# Patient Record
Sex: Female | Born: 1950 | Race: White | Hispanic: No | Marital: Married | Smoking: Never smoker
Health system: Southern US, Community
[De-identification: ages and names within clinical notes are randomized; demographics above are authoritative.]

## PROBLEM LIST (undated history)

## (undated) DIAGNOSIS — K219 Gastro-esophageal reflux disease without esophagitis: Secondary | ICD-10-CM

## (undated) DIAGNOSIS — T4145XA Adverse effect of unspecified anesthetic, initial encounter: Secondary | ICD-10-CM

## (undated) DIAGNOSIS — E079 Disorder of thyroid, unspecified: Secondary | ICD-10-CM

## (undated) DIAGNOSIS — G4730 Sleep apnea, unspecified: Secondary | ICD-10-CM

## (undated) DIAGNOSIS — T7840XA Allergy, unspecified, initial encounter: Secondary | ICD-10-CM

## (undated) DIAGNOSIS — K449 Diaphragmatic hernia without obstruction or gangrene: Secondary | ICD-10-CM

## (undated) DIAGNOSIS — H269 Unspecified cataract: Secondary | ICD-10-CM

## (undated) DIAGNOSIS — E611 Iron deficiency: Secondary | ICD-10-CM

## (undated) DIAGNOSIS — Z9889 Other specified postprocedural states: Secondary | ICD-10-CM

## (undated) DIAGNOSIS — H18519 Endothelial corneal dystrophy, unspecified eye: Secondary | ICD-10-CM

## (undated) DIAGNOSIS — M1990 Unspecified osteoarthritis, unspecified site: Secondary | ICD-10-CM

## (undated) DIAGNOSIS — J302 Other seasonal allergic rhinitis: Secondary | ICD-10-CM

## (undated) DIAGNOSIS — H9190 Unspecified hearing loss, unspecified ear: Secondary | ICD-10-CM

## (undated) DIAGNOSIS — R42 Dizziness and giddiness: Secondary | ICD-10-CM

## (undated) DIAGNOSIS — M810 Age-related osteoporosis without current pathological fracture: Secondary | ICD-10-CM

## (undated) DIAGNOSIS — T8859XA Other complications of anesthesia, initial encounter: Secondary | ICD-10-CM

## (undated) DIAGNOSIS — I10 Essential (primary) hypertension: Secondary | ICD-10-CM

## (undated) DIAGNOSIS — H1851 Endothelial corneal dystrophy: Secondary | ICD-10-CM

## (undated) DIAGNOSIS — K3184 Gastroparesis: Secondary | ICD-10-CM

## (undated) DIAGNOSIS — R112 Nausea with vomiting, unspecified: Secondary | ICD-10-CM

## (undated) HISTORY — DX: Endothelial corneal dystrophy, unspecified eye: H18.519

## (undated) HISTORY — PX: BLADDER REPAIR: SHX76

## (undated) HISTORY — PX: COLONOSCOPY: SHX174

## (undated) HISTORY — DX: Endothelial corneal dystrophy: H18.51

## (undated) HISTORY — PX: BUNIONECTOMY: SHX129

## (undated) HISTORY — DX: Unspecified osteoarthritis, unspecified site: M19.90

## (undated) HISTORY — PX: PELVIC LAPAROSCOPY: SHX162

## (undated) HISTORY — PX: ABDOMINAL HYSTERECTOMY: SHX81

## (undated) HISTORY — DX: Dizziness and giddiness: R42

## (undated) HISTORY — DX: Allergy, unspecified, initial encounter: T78.40XA

## (undated) HISTORY — DX: Disorder of thyroid, unspecified: E07.9

## (undated) HISTORY — DX: Gastro-esophageal reflux disease without esophagitis: K21.9

## (undated) HISTORY — DX: Gastroparesis: K31.84

## (undated) HISTORY — DX: Iron deficiency: E61.1

## (undated) HISTORY — PX: RHINOPLASTY: SUR1284

## (undated) HISTORY — DX: Sleep apnea, unspecified: G47.30

## (undated) HISTORY — PX: UPPER GASTROINTESTINAL ENDOSCOPY: SHX188

## (undated) HISTORY — DX: Essential (primary) hypertension: I10

## (undated) HISTORY — DX: Unspecified cataract: H26.9

## (undated) HISTORY — DX: Age-related osteoporosis without current pathological fracture: M81.0

## (undated) HISTORY — DX: Diaphragmatic hernia without obstruction or gangrene: K44.9

## (undated) HISTORY — DX: Unspecified hearing loss, unspecified ear: H91.90

---

## 1978-04-29 HISTORY — PX: RHINOPLASTY: SUR1284

## 1998-05-17 ENCOUNTER — Encounter: Payer: Self-pay | Admitting: Gastroenterology

## 1998-05-17 ENCOUNTER — Ambulatory Visit (HOSPITAL_COMMUNITY): Admission: RE | Admit: 1998-05-17 | Discharge: 1998-05-17 | Payer: Self-pay | Admitting: Gastroenterology

## 1998-12-22 ENCOUNTER — Other Ambulatory Visit: Admission: RE | Admit: 1998-12-22 | Discharge: 1998-12-22 | Payer: Self-pay | Admitting: Obstetrics and Gynecology

## 2000-04-29 HISTORY — PX: NISSEN FUNDOPLICATION: SHX2091

## 2000-08-04 ENCOUNTER — Ambulatory Visit (HOSPITAL_COMMUNITY): Admission: RE | Admit: 2000-08-04 | Discharge: 2000-08-04 | Payer: Self-pay | Admitting: Gastroenterology

## 2000-08-19 ENCOUNTER — Ambulatory Visit (HOSPITAL_COMMUNITY): Admission: RE | Admit: 2000-08-19 | Discharge: 2000-08-19 | Payer: Self-pay | Admitting: General Surgery

## 2000-08-19 ENCOUNTER — Encounter: Payer: Self-pay | Admitting: General Surgery

## 2000-08-19 DIAGNOSIS — K3184 Gastroparesis: Secondary | ICD-10-CM

## 2000-08-19 HISTORY — DX: Gastroparesis: K31.84

## 2000-09-03 ENCOUNTER — Encounter (INDEPENDENT_AMBULATORY_CARE_PROVIDER_SITE_OTHER): Payer: Self-pay

## 2000-09-03 ENCOUNTER — Other Ambulatory Visit: Admission: RE | Admit: 2000-09-03 | Discharge: 2000-09-03 | Payer: Self-pay | Admitting: Obstetrics and Gynecology

## 2003-04-30 HISTORY — PX: ABDOMINAL HYSTERECTOMY: SHX81

## 2004-03-19 ENCOUNTER — Inpatient Hospital Stay: Payer: Self-pay | Admitting: Obstetrics and Gynecology

## 2004-06-27 ENCOUNTER — Ambulatory Visit: Payer: Self-pay | Admitting: Obstetrics and Gynecology

## 2009-03-02 ENCOUNTER — Encounter: Admission: RE | Admit: 2009-03-02 | Discharge: 2009-03-02 | Payer: Self-pay | Admitting: Gastroenterology

## 2009-04-06 ENCOUNTER — Ambulatory Visit (HOSPITAL_COMMUNITY): Admission: RE | Admit: 2009-04-06 | Discharge: 2009-04-06 | Payer: Self-pay | Admitting: Gastroenterology

## 2009-04-26 ENCOUNTER — Encounter: Admission: RE | Admit: 2009-04-26 | Discharge: 2009-04-26 | Payer: Self-pay | Admitting: Gastroenterology

## 2009-07-21 ENCOUNTER — Emergency Department (HOSPITAL_COMMUNITY): Admission: EM | Admit: 2009-07-21 | Discharge: 2009-07-21 | Payer: Self-pay | Admitting: Emergency Medicine

## 2010-05-20 ENCOUNTER — Encounter: Payer: Self-pay | Admitting: Gastroenterology

## 2010-09-14 NOTE — Op Note (Signed)
[facility]  Patient:    Jennifer Alvarez, STINSON                       MRN: [id number] Proc. Date: 08/04/00 Attending:  Verlin Grills, M.D. CC:         Adolph Pollack, M.D.   Operative Report  PROCEDURE:  Esophageal manometry.  ENDOSCOPIST:  Verlin Grills, M.D.  REFERRING PHYSICIAN:  Adolph Pollack, M.D.  INDICATION:  [name]. [name] (date of birth 03-31-1951) is a 61 year old female with gastroesophageal reflux disease manifested by heartburn and regurgitation.  On a double-dose proton pump inhibitor medication, she has reasonable control of her heartburn but poor control of her regurgitation. Ms. [name] is being evaluated for laparoscopic antireflux surgery.  Ms. [name] underwent an esophageal manometry at the [facility] on August 04, 2000.  Lower esophageal sphincter function:  The resting lower esophageal sphincter pressure is 11.3 mmHg which is on the low end of normal.  The sphincter relaxes 75% for a duration of 6.1 seconds with swallow.  Esophageal body function based on 10 wet swallows: 46% of her wet swallows induced peristaltic esophageal body contraction; 54% of her wet swallows resulted in nonperistaltic esophageal body contraction.  The average wave amplitude of the peristaltic contractions was 113 mmHg which is normal.  IMPRESSION: 1. Lowish resting lower esophageal sphincter pressure. 2. Esophageal body function consistent with diffuse esophageal spasm. DD:  08/15/00 TD:  08/18/00 Job: [id number]

## 2011-07-07 ENCOUNTER — Ambulatory Visit (INDEPENDENT_AMBULATORY_CARE_PROVIDER_SITE_OTHER): Payer: BC Managed Care – PPO | Admitting: Family Medicine

## 2011-07-07 VITALS — BP 133/84 | HR 71 | Temp 98.6°F | Resp 16 | Ht 59.0 in | Wt 132.6 lb

## 2011-07-07 DIAGNOSIS — J069 Acute upper respiratory infection, unspecified: Secondary | ICD-10-CM

## 2011-07-07 DIAGNOSIS — K219 Gastro-esophageal reflux disease without esophagitis: Secondary | ICD-10-CM

## 2011-07-07 MED ORDER — METHYLPREDNISOLONE 4 MG PO TABS: ORAL_TABLET | ORAL | Status: DC

## 2011-07-07 NOTE — Progress Notes (Signed)
61 yo woman being treated for sinus infection x 10 days with cefdinir and cortisone shot.  Improved for 4 days.  By Wednesday, though, fatigue began to return and rhinorrhea returned.  On Friday the cough had worsened and the sinuses were filling up.  Has continued to have progressive cough with phlegm.  O:  NAD, sinus sounding voice, alert Skin warm and dry HEENT:  Unremarkable Chest: CTA  A:  URI - sinus  P: Medrol 4 mg 3 -2-2-2-1-1-1

## 2011-08-23 ENCOUNTER — Telehealth: Payer: Self-pay | Admitting: Internal Medicine

## 2011-08-23 NOTE — Telephone Encounter (Signed)
Received copies from [facility] ,on 08/23/11 . Forwarded 19  pages to Dr.  Nobie Putnam review.

## 2011-09-02 ENCOUNTER — Encounter: Payer: Self-pay | Admitting: *Deleted

## 2011-09-10 ENCOUNTER — Encounter: Payer: Self-pay | Admitting: Internal Medicine

## 2011-09-10 ENCOUNTER — Ambulatory Visit (INDEPENDENT_AMBULATORY_CARE_PROVIDER_SITE_OTHER): Payer: Self-pay | Admitting: Internal Medicine

## 2011-09-10 VITALS — BP 134/76 | HR 62 | Ht 59.0 in | Wt 134.0 lb

## 2011-09-10 DIAGNOSIS — K219 Gastro-esophageal reflux disease without esophagitis: Secondary | ICD-10-CM

## 2011-09-10 MED ORDER — DEXLANSOPRAZOLE 60 MG PO CPDR: 60.0000 mg | DELAYED_RELEASE_CAPSULE | Freq: Two times a day (BID) | ORAL | Status: DC

## 2011-09-10 NOTE — Patient Instructions (Signed)
You have been scheduled for an endoscopy with propofol. Please follow written instructions given to you at your visit today. You have been scheduled for a Barium Esophogram at [facility] (1st floor of the hospital) on Wednesday 09/18/11 at 9:30 am. Please arrive 15 minutes prior to your appointment for registration. Make certain not to have anything to eat or drink 6 hours prior to your test. If you need to reschedule for any reason, please contact radiology at [phone] to do so. We have sent the following medications to your pharmacy for you to pick up at your convenience: Dexilant 60 mg twice daily. CC:Dr Marjory Lies

## 2011-09-10 NOTE — Progress Notes (Addendum)
Jennifer Alvarez 07/25/1950 MRN [id number]  History of Present Illness:  This is a 61 year old white female with chronic gastroesophageal reflux who comes for a second opinion of intractable reflux. She had a Nissen fundoplication in 2002 by Dr. Katrinka Blazing at [facility]. The surgery was successful in reducing her reflux. She had no symptoms until April 2006 when she had an episode of vomiting and since then has had problems with continued reflux, regurgitation and esophageal spasm. A barium swallow in November 2010 showed disruption of three out of four primary esophageal peristaltic waves, a large hernia with free reflux into the esophagus. A 13 mm tablet passed without delay. An upper endoscopy in December 2007 showed a loose wrap. Prior to her initial surgery, a gastric emptying scan showed delayed emptying. 39% of the radio nucleotide remained in the stomach in 2 hours. Her last colonoscopy in December 2007 showed scattered diverticuli. She gives a history of peptic ulcer at age 35. She says that she is tired of being told to double up on her PPIs. She cannot eat after 7 PM and it interferes with her being able to eat out. She needs at least 4-5 hours between meals and laying down to digest her food. She wakes up at 4 or 5 in the morning vomiting undigested food. She used to take AcipHex 20 mg a day which controlled her symptoms reasonably well but her insurance no longer covers it. She is currently on Prilosec 20 mg twice a day and ranitidine 75 mg twice a day.   Past Medical History  Diagnosis Date  . Gastroparesis 08/19/00  . Diverticulosis   . GERD (gastroesophageal reflux disease)   . Hiatal hernia   . Esophageal spasm   . Migraine   . Anxiety   . Arthritis   . Asthma   . Depression   . Hypertension   . Hyperlipemia    Past Surgical History  Procedure Date  . Bunionectomy     right foot  . Rhinoplasty   . Pelvic laparoscopy   . Bladder repair   . Nissen fundoplication   .  Abdominal hysterectomy     reports that she has never smoked. She has never used smokeless tobacco. She reports that she does not drink alcohol or use illicit drugs. family history includes Esophageal cancer in her father and Heart disease in her father.  There is no history of Colon cancer. Allergies  Allergen Reactions  . Vicodin (Hydrocodone-Acetaminophen) Nausea And Vomiting  . Celebrex (Celecoxib) Palpitations  . Demerol Rash  . Valium Rash        Review of Systems: Denies dysphagia or odynophagia. Denies abdominal pain or change in bowel habits area to weight has been stable  The remainder of the 10 point ROS is negative except as outlined in H&P   Physical Exam: General appearance  Well developed, in no distress. Eyes- non icteric. HEENT nontraumatic, normocephalic. Normal voice. No hoarseness Mouth no lesions, tongue papillated, no cheilosis. Neck supple without adenopathy, thyroid not enlarged, no carotid bruits, no JVD. Lungs Clear to auscultation bilaterally. Cor normal S1, normal S2, regular rhythm, no murmur,  quiet precordium. Abdomen: Soft nontender abdomen with normal active bowel sounds. No distention. Liver edge at costal margin. Rectal: Not done. Extremities no pedal edema. Skin no lesions. Neurological alert and oriented x 3. Psychological normal mood and affect.  Assessment and Plan:  Problem #1 Chronic gastroesophageal reflux who is status post Nissen fundoplication in 2002 which has undone itself as  demonstrated on a recent barium esophagram from 2010. An upper endoscopy confirmed a loose wrap. Her symptoms are consistent with recurrent gastroesophageal reflux uncontrolled with PPIs. She has esophageal dysmotility demonstrated on a barium esophagram. Disruption of propulsive peristalsis could be secondary to chronic gastroesophageal reflux. She also had mild gastroparesis and this may have to be reevaluated before considering redo of the fundoplication.  First, I would like to try her on a stronger PPI; specifically Dexilant 60 mg twice a day. Samples have been given. We will repeat the barium esophagram and upper endoscopy to rule out Barrett's esophagus. She may need a gastric emptying scan to assess whether she would be a candidate for redo of the fundoplication.   09/10/2011 Lina Sar

## 2011-09-17 ENCOUNTER — Ambulatory Visit (AMBULATORY_SURGERY_CENTER): Payer: BC Managed Care – PPO | Admitting: Internal Medicine

## 2011-09-17 ENCOUNTER — Encounter: Payer: Self-pay | Admitting: Internal Medicine

## 2011-09-17 VITALS — BP 127/68 | HR 72 | Temp 97.6°F | Resp 16 | Ht 59.0 in | Wt 134.0 lb

## 2011-09-17 DIAGNOSIS — K219 Gastro-esophageal reflux disease without esophagitis: Secondary | ICD-10-CM

## 2011-09-17 DIAGNOSIS — K2270 Barrett's esophagus without dysplasia: Secondary | ICD-10-CM

## 2011-09-17 MED ORDER — SODIUM CHLORIDE 0.9 % IV SOLN: 500.0000 mL | INTRAVENOUS | Status: DC

## 2011-09-17 NOTE — Patient Instructions (Signed)
Discharge instructions given with verbal understanding. Handouts on hiatal hernia. Resume previous medications.YOU HAD AN ENDOSCOPIC PROCEDURE TODAY AT THE [facility]: Refer to the procedure report that was given to you for any specific questions about what was found during the examination.  If the procedure report does not answer your questions, please call your gastroenterologist to clarify.  If you requested that your care partner not be given the details of your procedure findings, then the procedure report has been included in a sealed envelope for you to review at your convenience later.  YOU SHOULD EXPECT: Some feelings of bloating in the abdomen. Passage of more gas than usual.  Walking can help get rid of the air that was put into your GI tract during the procedure and reduce the bloating. If you had a lower endoscopy (such as a colonoscopy or flexible sigmoidoscopy) you may notice spotting of blood in your stool or on the toilet paper. If you underwent a bowel prep for your procedure, then you may not have a normal bowel movement for a few days.  DIET: Your first meal following the procedure should be a light meal and then it is ok to progress to your normal diet.  A half-sandwich or bowl of soup is an example of a good first meal.  Heavy or fried foods are harder to digest and may make you feel nauseous or bloated.  Likewise meals heavy in dairy and vegetables can cause extra gas to form and this can also increase the bloating.  Drink plenty of fluids but you should avoid alcoholic beverages for 24 hours.  ACTIVITY: Your care partner should take you home directly after the procedure.  You should plan to take it easy, moving slowly for the rest of the day.  You can resume normal activity the day after the procedure however you should NOT DRIVE or use heavy machinery for 24 hours (because of the sedation medicines used during the test).    SYMPTOMS TO REPORT IMMEDIATELY: A  gastroenterologist can be reached at any hour.  During normal business hours, 8:30 AM to 5:00 PM Monday through Friday, call [phone].  After hours and on weekends, please call the GI answering service at [phone] who will take a message and have the physician on call contact you.   Following upper endoscopy (EGD)  Vomiting of blood or coffee ground material  New chest pain or pain under the shoulder blades  Painful or persistently difficult swallowing  New shortness of breath  Fever of 100F or higher  Black, tarry-looking stools  FOLLOW UP: If any biopsies were taken you will be contacted by phone or by letter within the next 1-3 weeks.  Call your gastroenterologist if you have not heard about the biopsies in 3 weeks.  Our staff will call the home number listed on your records the next business day following your procedure to check on you and address any questions or concerns that you may have at that time regarding the information given to you following your procedure. This is a courtesy call and so if there is no answer at the home number and we have not heard from you through the emergency physician on call, we will assume that you have returned to your regular daily activities without incident.  SIGNATURES/CONFIDENTIALITY: You and/or your care partner have signed paperwork which will be entered into your electronic medical record.  These signatures attest to the fact that that the information above on your  After Visit Summary has been reviewed and is understood.  Full responsibility of the confidentiality of this discharge information lies with you and/or your care-partner.

## 2011-09-17 NOTE — Op Note (Signed)
[facility] [address] [place], [place]  [address]  ENDOSCOPY PROCEDURE REPORT  PATIENT:  Jennifer Alvarez, Jennifer Alvarez  MR#:  [id number] BIRTHDATE:  06/01/1950, 61 yrs. old  GENDER:  female  ENDOSCOPIST:  Hedwig Morton. Juanda Chance, MD Referred by:  Marjory Lies, M.D.  PROCEDURE DATE:  09/17/2011 PROCEDURE:  EGD with biopsy, [id number] ASA CLASS:  Class II INDICATIONS:  Failed Nissen fundoplication from 2002, recurrent reflux since 2006, Ba esophagram showed dismotility in 2010 she has been refractory to PPI's, Dexilant very effective but insurance does not cover  MEDICATIONS:   MAC sedation, administered by CRNA, propofol (Diprivan) 180 mg TOPICAL ANESTHETIC:  none  DESCRIPTION OF PROCEDURE:   After the risks benefits and alternatives of the procedure were thoroughly explained, informed consent was obtained.  The LB GIF-H180 [id number] endoscope was introduced through the mouth and advanced to the first portion of the duodenum, without limitations.  The instrument was slowly withdrawn as the mucosa was fully examined. <<PROCEDUREIMAGES>>  A hiatal hernia was found (see image2, image1, and image4). 3-4 cm hiatal henis 29 -33 cm  inlet patch. With standard forceps, a biopsy was obtained and sent to pathology (see image10, image11, and image13).  Otherwise the examination was normal (see image9, image8, image7, image6, image5, and image1). undone Nissen Fundoplication    Retroflexed views revealed no abnormalities. The scope was then withdrawn from the patient and the procedure completed.  COMPLICATIONS:  None  ENDOSCOPIC IMPRESSION: 1) Hiatal hernia 2) Inlet patch 3) Otherwise normal examination undone NIssen RECOMMENDATIONS: 1) Anti-reflux regimen to be follow Ba esophagram, Dexilant 60 mg bid consider re-do Nissen  REPEAT EXAM:  In 0 year(s) for.  ______________________________ Hedwig Morton. Juanda Chance, MD  CC:  n. eSIGNED:   Hedwig Morton. Eliam Snapp at 09/17/2011 02:51 PM  Alma Downs,  [phone]

## 2011-09-17 NOTE — Progress Notes (Signed)
Patient did not experience any of the following events: a burn prior to discharge; a fall within the facility; wrong site/side/patient/procedure/implant event; or a hospital transfer or hospital admission upon discharge from the facility. (G8907) Patient did not have preoperative order for IV antibiotic SSI prophylaxis. (G8918)  

## 2011-09-18 ENCOUNTER — Other Ambulatory Visit: Payer: Self-pay | Admitting: *Deleted

## 2011-09-18 ENCOUNTER — Telehealth: Payer: Self-pay | Admitting: *Deleted

## 2011-09-18 ENCOUNTER — Ambulatory Visit (HOSPITAL_COMMUNITY)
Admission: RE | Admit: 2011-09-18 | Discharge: 2011-09-18 | Disposition: A | Discharge status: Home / Self Care | Payer: BC Managed Care – PPO | Source: Ambulatory Visit | Attending: Internal Medicine | Admitting: Internal Medicine

## 2011-09-18 ENCOUNTER — Encounter: Payer: Self-pay | Admitting: *Deleted

## 2011-09-18 DIAGNOSIS — K224 Dyskinesia of esophagus: Secondary | ICD-10-CM | POA: Insufficient documentation

## 2011-09-18 DIAGNOSIS — K449 Diaphragmatic hernia without obstruction or gangrene: Secondary | ICD-10-CM | POA: Insufficient documentation

## 2011-09-18 DIAGNOSIS — K219 Gastro-esophageal reflux disease without esophagitis: Secondary | ICD-10-CM

## 2011-09-18 NOTE — Telephone Encounter (Signed)
  Follow up Call-  Call back number 09/17/2011  Post procedure Call Back phone  # [phone]  Permission to leave phone message Yes     Patient questions:  Do you have a fever, pain , or abdominal swelling? no Pain Score  0 *  Have you tolerated food without any problems? yes  Have you been able to return to your normal activities? yes  Do you have any questions about your discharge instructions: Diet   no Medications  no Follow up visit  no  Do you have questions or concerns about your Care? no  Actions: * If pain score is 4 or above: No action needed, pain <4.

## 2011-09-18 NOTE — Telephone Encounter (Signed)
error 

## 2011-09-18 NOTE — Telephone Encounter (Signed)
Spoke with patient and gave her appointment with Dr. Wenda Low on 10/11/11 at 9:30 AM /10:00 AM.

## 2011-09-24 ENCOUNTER — Encounter: Payer: Self-pay | Admitting: Internal Medicine

## 2011-10-11 ENCOUNTER — Encounter (INDEPENDENT_AMBULATORY_CARE_PROVIDER_SITE_OTHER): Payer: Self-pay | Admitting: Surgery

## 2011-10-11 ENCOUNTER — Ambulatory Visit (INDEPENDENT_AMBULATORY_CARE_PROVIDER_SITE_OTHER): Payer: BC Managed Care – PPO | Admitting: Surgery

## 2011-10-11 ENCOUNTER — Other Ambulatory Visit (INDEPENDENT_AMBULATORY_CARE_PROVIDER_SITE_OTHER): Payer: Self-pay | Admitting: General Surgery

## 2011-10-11 ENCOUNTER — Other Ambulatory Visit (INDEPENDENT_AMBULATORY_CARE_PROVIDER_SITE_OTHER): Payer: Self-pay | Admitting: Surgery

## 2011-10-11 VITALS — BP 122/78 | HR 80 | Temp 98.5°F | Resp 14 | Ht 59.0 in | Wt 135.5 lb

## 2011-10-11 DIAGNOSIS — R109 Unspecified abdominal pain: Secondary | ICD-10-CM

## 2011-10-11 DIAGNOSIS — K219 Gastro-esophageal reflux disease without esophagitis: Secondary | ICD-10-CM

## 2011-10-11 NOTE — Patient Instructions (Addendum)
Obtain GB ultrasound preop

## 2011-10-11 NOTE — Progress Notes (Signed)
Chief Complaint:  Recurrent GERD after prior lap Nissen done at [facility] in 2002.  Recurrent symptoms noted in 2006  History of Present Illness:  Jennifer Alvarez is an 61 y.o. female who works at [employer] and is referred by Dr. Juanda Chance for recurrent GERD.  She was seen by Dr. Abbey Chatters back in 2002 who declined to operate on her. She subsequently had a [facility] by [facility]. Katrinka Blazing. She got along well from 2002 until 2006 when she awoke one night with recurrent regurgitation coughing choking. From then on she began having the same symptoms although she's been followed by Dr. Reece Agar who did not feel that this was related to reflux. She's had swallows both in 2010 and now in 2013 that show a slipped Nissen fundoplication with hiatal hernia. She would like to have this revised. I explained this operation to her in some detail and gave her a booklet on it and indicated that it is less successful the second time and certainly has higher risk of perforation and significant complications. She seems aware of these risk but really with blunt trauma returned to some degree of normalcy.   She has never had her gallbladder workup but I wondered whether some of her symptoms regarding fatty foods and gastric stasis could be related to diagnosed gallstones. We'll obtain an gallbladder also sound prior to performing her surgery. We'll going try to get her on a Wilson long for redo lap Nissen en bloc off 4 hours for such procedure.  Past Medical History  Diagnosis Date  . Gastroparesis 08/19/00  . Diverticulosis   . GERD (gastroesophageal reflux disease)   . Hiatal hernia   . Esophageal spasm   . Migraine     medically induced migraines only  . Anxiety     pt  denies-states is her mother  . Arthritis   . Asthma   . Depression     pt denies- states is her mom and sister  . Hypertension   . Hyperlipemia   . Fuchs' corneal dystrophy   . Allergy   . Cataract    early   . 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 Bunionectomy     right foot  . Rhinoplasty   . Pelvic laparoscopy   . Bladder repair   . Nissen fundoplication   . Abdominal hysterectomy   . Colonoscopy     Current Outpatient Prescriptions  Medication Sig Dispense Refill  . azithromycin (AZASITE) 1 % ophthalmic solution Place 1 drop into both eyes at bedtime.      . cetirizine (ZYRTEC) 10 MG tablet Take 10 mg by mouth daily.      Marland Kitchen moxifloxacin (VIGAMOX) 0.5 % ophthalmic solution Place 1 drop into the right eye 4 (four) times daily as needed.      Marland Kitchen PATANASE 0.6 % SOLN Place into the nose daily. As directed      . dexlansoprazole (DEXILANT) 60 MG capsule Take 1 capsule (60 mg total) by mouth 2 (two) times daily.  60 capsule  3  . omeprazole (PRILOSEC) 20 MG capsule Take 20 mg by mouth 2 (two) times daily.      . ranitidine (ZANTAC) 75 MG tablet Take 75 mg by mouth 2 (two) times daily.       Bee venom; Other; Peanut-containing drug products; Vicodin; Celebrex; Demerol; and Valium Family History  Problem Relation Age  of Onset  . Colon cancer Neg Hx   . Heart disease Father   . Esophageal cancer Father 59    died age 94  . Hypertension Mother     age 85  . Anxiety disorder Mother   . Depression Mother    Social History:   reports that she has never smoked. She has never used smokeless tobacco. She reports that she does not drink alcohol or use illicit drugs.   REVIEW OF SYSTEMS - PERTINENT POSITIVES ONLY: Denies any history of DVT  Physical Exam:   Blood pressure 122/78, pulse 80, temperature 98.5 F (36.9 C), temperature source Temporal, resp. rate 14, height 4\' 11"  (1.499 m), weight 135 lb 8 oz (61.462 kg). Body mass index is 27.37 kg/(m^2).  Gen:  WDWN WF NAD  Neurological: Alert and oriented to person, place, and time. Motor and sensory function is grossly intact  Head: Normocephalic and atraumatic.  Eyes:  Conjunctivae are normal. Pupils are equal, round, and reactive to light. No scleral icterus.  Neck: Normal range of motion. Neck supple. No tracheal deviation or thyromegaly present.  Cardiovascular:  SR without murmurs or gallops.  No carotid bruits Respiratory: Effort normal.  No respiratory distress. No chest wall tenderness. Breath sounds normal.  No wheezes, rales or rhonchi.  Abdomen:  Nontender.  Lap incisions are well healed GU: Musculoskeletal: Normal range of motion. Extremities are nontender. No cyanosis, edema or clubbing noted Lymphadenopathy: No cervical, preauricular, postauricular or axillary adenopathy is present Skin: Skin is warm and dry. No rash noted. No diaphoresis. No erythema. No pallor. Pscyh: Normal mood and affect. Behavior is normal. Judgment and thought content normal.   LABORATORY RESULTS: No results found for this or any previous visit (from the past 48 hour(s)).  RADIOLOGY RESULTS: No results found.  Problem List: Patient Active Problem List  Diagnosis  . GERD (gastroesophageal reflux disease)    Assessment & Plan: I have reviewed her UGI and see what I believe to be a herniation above her wrap and the wrap residing up in a hiatal hernia.   Plan:  GB ultrasound and schedule for redo lap Nissen.     Matt B. Daphine Deutscher, MD, [facility] [phone] beeper [phone]  10/11/2011 10:54 AM

## 2011-10-16 ENCOUNTER — Ambulatory Visit
Admission: RE | Admit: 2011-10-16 | Discharge: 2011-10-16 | Disposition: A | Discharge status: Home / Self Care | Payer: BC Managed Care – PPO | Source: Ambulatory Visit | Attending: Surgery | Admitting: Surgery

## 2011-10-16 DIAGNOSIS — R109 Unspecified abdominal pain: Secondary | ICD-10-CM

## 2011-10-24 ENCOUNTER — Telehealth (INDEPENDENT_AMBULATORY_CARE_PROVIDER_SITE_OTHER): Payer: Self-pay | Admitting: General Surgery

## 2011-10-24 NOTE — Telephone Encounter (Signed)
Pt called and she was given the results to her [place] from 10/11/11. Results were negative. Pt verbalized understanding,

## 2011-10-24 NOTE — Telephone Encounter (Signed)
Message copied by Latricia Heft on Thu Oct 24, 2011  3:53 PM ------      Message from: Marnette Burgess      Created: Thu Oct 24, 2011  1:23 PM      Contact: [phone]       Patient calling for tests results, please call.

## 2011-10-24 NOTE — Telephone Encounter (Signed)
Message copied by Latricia Heft on Thu Oct 24, 2011  3:59 PM ------      Message from: Marnette Burgess      Created: Thu Oct 24, 2011  1:23 PM      Contact: [phone]       Patient calling for tests results, please call.

## 2011-12-06 ENCOUNTER — Encounter (HOSPITAL_COMMUNITY): Payer: Self-pay | Admitting: Pharmacy Technician

## 2011-12-12 ENCOUNTER — Encounter (HOSPITAL_COMMUNITY)
Admission: RE | Admit: 2011-12-12 | Discharge: 2011-12-12 | Disposition: A | Discharge status: Home / Self Care | Payer: BC Managed Care – PPO | Source: Ambulatory Visit | Attending: Surgery | Admitting: Surgery

## 2011-12-12 ENCOUNTER — Encounter (HOSPITAL_COMMUNITY): Payer: Self-pay

## 2011-12-12 HISTORY — DX: Other complications of anesthesia, initial encounter: T88.59XA

## 2011-12-12 HISTORY — DX: Other seasonal allergic rhinitis: J30.2

## 2011-12-12 HISTORY — DX: Adverse effect of unspecified anesthetic, initial encounter: T41.45XA

## 2011-12-12 HISTORY — DX: Other specified postprocedural states: R11.2

## 2011-12-12 HISTORY — DX: Other specified postprocedural states: Z98.890

## 2011-12-12 LAB — CBC
HCT: 36.6 % (ref 36.0–46.0)
Hemoglobin: 12.1 g/dL (ref 12.0–15.0)
MCV: 81.3 fL (ref 78.0–100.0)
RBC: 4.5 MIL/uL (ref 3.87–5.11)
RDW: 14.9 % (ref 11.5–15.5)
WBC: 5.2 10*3/uL (ref 4.0–10.5)

## 2011-12-12 LAB — BASIC METABOLIC PANEL
BUN: 11 mg/dL (ref 6–23)
CO2: 29 mEq/L (ref 19–32)
Chloride: 102 mEq/L (ref 96–112)
Creatinine, Ser: 0.84 mg/dL (ref 0.50–1.10)
GFR calc Af Amer: 85 mL/min — ABNORMAL LOW (ref >90)
Glucose, Bld: 107 mg/dL — ABNORMAL HIGH (ref 70–99)
Potassium: 3.9 mEq/L (ref 3.5–5.1)

## 2011-12-12 LAB — ABO/RH: ABO/RH(D): O POS

## 2011-12-12 LAB — SURGICAL PCR SCREEN: Staphylococcus aureus: NEGATIVE

## 2011-12-12 NOTE — Patient Instructions (Signed)
[address] K[address] [facility]  12/12/2011   Your procedure is scheduled on:  12/18/11  Wednesday  Surgery [id number]  Report to [facility] at  0630     AM.  Call this number if you have problems the morning of surgery: [phone]     Or PST   [id number]  Priscille Shadduck FLEETS  ENEMA PER RECTUM   Night before surgery  Remember:   Do not eat food or drink any fluids :After Midnight. Tuesday NIGHT   Take these medicines the morning of surgery with A SIP OF WATER:ZYRTEC, DEXILIANT                                          May use pantanase or flonase if needed   Do not wear jewelry, make-up or nail polish.  Do not wear lotions, powders, or perfumes. You may wear deodorant.  Do not shave 48 hours prior to surgery.  Do not bring valuables to the hospital.  Contacts, dentures or bridgework may not be worn into surgery.  Leave suitcase in the car. After surgery it may be brought to your room.  For patients admitted to the hospital, checkout time is 11:00 AM the day of discharge.   Patients discharged the day of surgery will not be allowed to drive home.  Name and phone number of your driver:     husband                                                                 Special Instructions: CHG Shower Use Special Wash: 1/2 bottle night before surgery and 1/2 bottle morning of surgery. REGULAR SOAP FACE AND PRIVATES              LADIES- NO SHAVING 48 HOURS BEFORE USING BETASEPT SOAP.                  Please read over the following fact sheets that you were given: MRSA Information

## 2011-12-17 ENCOUNTER — Telehealth (INDEPENDENT_AMBULATORY_CARE_PROVIDER_SITE_OTHER): Payer: Self-pay

## 2011-12-17 NOTE — H&P (Signed)
Chief Complaint: Recurrent GERD after prior lap Nissen done at [facility] in 2002. Recurrent symptoms noted in 2006  History of Present Illness: Jennifer Alvarez is an 61 y.o. female who works at [employer] and is referred by Dr. Juanda Chance for recurrent GERD. She was seen by Dr. Abbey Chatters back in 2002 who declined to operate on her. She subsequently had a successful N[facility] by [facility]. Katrinka Blazing. She got along well from 2002 until 2006 when she awoke one night with recurrent regurgitation coughing choking. From then on she began having the same symptoms although she's been followed by Dr. Reece Agar who did not feel that this was related to reflux. She's had swallows both in 2010 and now in 2013 that show a slipped Nissen fundoplication with hiatal hernia. She would like to have this revised. I explained this operation to her in some detail and gave her a booklet on it and indicated that it is less successful the second time and certainly has higher risk of perforation and significant complications. She seems aware of these risks.  She has never had her gallbladder workup but I wondered whether some of her symptoms regarding fatty foods and gastric stasis could be related to diagnosed gallstones. We'll obtain an gallbladder also sound prior to performing her surgery. (THIS WAS NEGATIVE) Will bloc off 4 hours for such redo lap Nissen procedure.  Past Medical History   Diagnosis  Date   .  Gastroparesis  08/19/00   .  Diverticulosis    .  GERD (gastroesophageal reflux disease)    .  Hiatal hernia    .  Esophageal spasm    .  Migraine      medically induced migraines only   .  Anxiety      pt denies-states is her mother   .  Arthritis    .  Asthma    .  Depression      pt denies- states is her mom and sister   .  Hypertension    .  Hyperlipemia    .  Fuchs' corneal dystrophy    .  Allergy    .  Cataract      early   .  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  Bunionectomy      right foot   .  Rhinoplasty    .  Pelvic laparoscopy    .  Bladder repair    .  Nissen fundoplication    .  Abdominal hysterectomy    .  Colonoscopy     Current Outpatient Prescriptions   Medication  Sig  Dispense  Refill   .  azithromycin (AZASITE) 1 % ophthalmic solution  Place 1 drop into both eyes at bedtime.     .  cetirizine (ZYRTEC) 10 MG tablet  Take 10 mg by mouth daily.     Marland Kitchen  moxifloxacin (VIGAMOX) 0.5 % ophthalmic solution  Place 1 drop into the right eye 4 (four) times daily as needed.     Marland Kitchen  PATANASE 0.6 % SOLN  Place into the nose daily. As directed     .  dexlansoprazole (DEXILANT) 60 MG capsule  Take 1 capsule (60 mg total) by mouth 2 (two) times daily.  60 capsule  3   .  omeprazole (PRILOSEC) 20 MG capsule  Take 20 mg by mouth  2 (two) times daily.     .  ranitidine (ZANTAC) 75 MG tablet  Take 75 mg by mouth 2 (two) times daily.     Bee venom; Other; Peanut-containing drug products; Vicodin; Celebrex; Demerol; and Valium  Family History   Problem  Relation  Age of Onset   .  Colon cancer  Neg Hx    .  Heart disease  Father    .  Esophageal cancer  Father  64      died age 48    .  Hypertension  Mother       age 29    .  Anxiety disorder  Mother    .  Depression  Mother    Social History: reports that she has never smoked. She has never used smokeless tobacco. She reports that she does not drink alcohol or use illicit drugs.  REVIEW OF SYSTEMS - PERTINENT POSITIVES ONLY:  Denies any history of DVT  Physical Exam:  Blood pressure 122/78, pulse 80, temperature 98.5 F (36.9 C), temperature source Temporal, resp. rate 14, height 4\' 11"  (1.499 m), weight 135 lb 8 oz (61.462 kg).  Body mass index is 27.37 kg/(m^2).  Gen: WDWN WF NAD  Neurological: Alert and oriented to person, place, and time. Motor and sensory function is grossly intact  Head: Normocephalic and atraumatic.  Eyes: Conjunctivae  are normal. Pupils are equal, round, and reactive to light. No scleral icterus.  Neck: Normal range of motion. Neck supple. No tracheal deviation or thyromegaly present.  Cardiovascular: SR without murmurs or gallops. No carotid bruits  Respiratory: Effort normal. No respiratory distress. No chest wall tenderness. Breath sounds normal. No wheezes, rales or rhonchi.  Abdomen: Nontender. Lap incisions are well healed  GU:  Musculoskeletal: Normal range of motion. Extremities are nontender. No cyanosis, edema or clubbing noted Lymphadenopathy: No cervical, preauricular, postauricular or axillary adenopathy is present Skin: Skin is warm and dry. No rash noted. No diaphoresis. No erythema. No pallor. Pscyh: Normal mood and affect. Behavior is normal. Judgment and thought content normal.  LABORATORY RESULTS:  No results found for this or any previous visit (from the past 48 hour(s)).  RADIOLOGY RESULTS:  No results found.  Problem List:  Patient Active Problem List   Diagnosis   .  GERD (gastroesophageal reflux disease)   Assessment & Plan:  I have reviewed her UGI and see what I believe to be a herniation above her wrap and the wrap residing up in a hiatal hernia. Ultrasound was negative for gallstones Redo Lap Nissenfundopication Matt B. Daphine Deutscher, MD, [facility]  [phone] beeper  [phone]

## 2011-12-17 NOTE — Telephone Encounter (Signed)
Pt called c/o sore throat and cough, no fever this a.m.  She stated she has a hx of strep and is very concerned that her surgery will have to be cancelled.  She is scheduled for a lap nissen on 8/21 at [facility] by Dr. Daphine Deutscher.  I paged Dr. Daphine Deutscher who instructed me to tell the pt to be seen today by her PCP and get a strep test.  Pt will call later today with results.

## 2011-12-18 ENCOUNTER — Encounter (HOSPITAL_COMMUNITY): Payer: Self-pay | Admitting: Surgery

## 2011-12-18 ENCOUNTER — Inpatient Hospital Stay (HOSPITAL_COMMUNITY)
Admission: RE | Admit: 2011-12-18 | Discharge: 2011-12-21 | DRG: 154 | Disposition: A | Discharge status: Home / Self Care | Payer: BC Managed Care – PPO | Source: Ambulatory Visit | Attending: Surgery | Admitting: Surgery

## 2011-12-18 ENCOUNTER — Ambulatory Visit (HOSPITAL_COMMUNITY): Payer: BC Managed Care – PPO | Admitting: Anesthesiology

## 2011-12-18 ENCOUNTER — Encounter (HOSPITAL_COMMUNITY)
Admission: RE | Disposition: A | Discharge status: Home / Self Care | Payer: Self-pay | Source: Ambulatory Visit | Attending: Surgery

## 2011-12-18 ENCOUNTER — Encounter (HOSPITAL_COMMUNITY): Payer: Self-pay | Admitting: Anesthesiology

## 2011-12-18 DIAGNOSIS — K219 Gastro-esophageal reflux disease without esophagitis: Principal | ICD-10-CM | POA: Diagnosis present

## 2011-12-18 DIAGNOSIS — E785 Hyperlipidemia, unspecified: Secondary | ICD-10-CM | POA: Diagnosis present

## 2011-12-18 DIAGNOSIS — I10 Essential (primary) hypertension: Secondary | ICD-10-CM | POA: Diagnosis present

## 2011-12-18 DIAGNOSIS — Z79899 Other long term (current) drug therapy: Secondary | ICD-10-CM

## 2011-12-18 DIAGNOSIS — K2100 Gastro-esophageal reflux disease with esophagitis, without bleeding: Secondary | ICD-10-CM

## 2011-12-18 DIAGNOSIS — H18519 Endothelial corneal dystrophy, unspecified eye: Secondary | ICD-10-CM | POA: Diagnosis present

## 2011-12-18 DIAGNOSIS — Y838 Other surgical procedures as the cause of abnormal reaction of the patient, or of later complication, without mention of misadventure at the time of the procedure: Secondary | ICD-10-CM | POA: Diagnosis present

## 2011-12-18 DIAGNOSIS — J45909 Unspecified asthma, uncomplicated: Secondary | ICD-10-CM | POA: Diagnosis present

## 2011-12-18 DIAGNOSIS — K929 Disease of digestive system, unspecified: Secondary | ICD-10-CM | POA: Diagnosis present

## 2011-12-18 DIAGNOSIS — K449 Diaphragmatic hernia without obstruction or gangrene: Secondary | ICD-10-CM | POA: Diagnosis present

## 2011-12-18 HISTORY — PX: LAPAROSCOPIC NISSEN FUNDOPLICATION: SHX1932

## 2011-12-18 LAB — CBC
HCT: 34.8 % — ABNORMAL LOW (ref 36.0–46.0)
MCV: 80.9 fL (ref 78.0–100.0)
RBC: 4.3 MIL/uL (ref 3.87–5.11)
WBC: 13.1 10*3/uL — ABNORMAL HIGH (ref 4.0–10.5)

## 2011-12-18 LAB — CREATININE, SERUM: GFR calc Af Amer: >90 mL/min (ref >90)

## 2011-12-18 LAB — TYPE AND SCREEN

## 2011-12-18 SURGERY — FUNDOPLICATION, NISSEN, LAPAROSCOPIC
Anesthesia: General | Site: Abdomen | Wound class: Clean

## 2011-12-18 MED ORDER — KCL IN DEXTROSE-NACL 20-5-0.45 MEQ/L-%-% IV SOLN
INTRAVENOUS | Status: DC
  Administered 2011-12-18 – 2011-12-21 (×5): via INTRAVENOUS
  Filled 2011-12-18 (×7): qty 1000

## 2011-12-18 MED ORDER — SUCCINYLCHOLINE CHLORIDE 20 MG/ML IJ SOLN
INTRAMUSCULAR | Status: DC | PRN
  Administered 2011-12-18: 100 mg via INTRAVENOUS

## 2011-12-18 MED ORDER — ONDANSETRON HCL 4 MG/2ML IJ SOLN
4.0000 mg | INTRAMUSCULAR | Status: DC | PRN
  Administered 2011-12-18 – 2011-12-19 (×3): 4 mg via INTRAVENOUS
  Filled 2011-12-18 (×3): qty 2

## 2011-12-18 MED ORDER — PROMETHAZINE HCL 25 MG/ML IJ SOLN
6.2500 mg | INTRAMUSCULAR | Status: DC | PRN
  Administered 2011-12-18: 12.5 mg via INTRAVENOUS

## 2011-12-18 MED ORDER — LACTATED RINGERS IV SOLN
INTRAVENOUS | Status: DC | PRN
  Administered 2011-12-18 (×3): via INTRAVENOUS

## 2011-12-18 MED ORDER — TISSEEL VH 10 ML EX KIT
PACK | CUTANEOUS | Status: DC | PRN
  Administered 2011-12-18: 10 mL

## 2011-12-18 MED ORDER — LACTATED RINGERS IR SOLN
Status: DC | PRN
  Administered 2011-12-18: 3000 mL

## 2011-12-18 MED ORDER — PROPOFOL 10 MG/ML IV BOLUS
INTRAVENOUS | Status: DC | PRN
  Administered 2011-12-18: 170 mg via INTRAVENOUS

## 2011-12-18 MED ORDER — SUFENTANIL CITRATE 50 MCG/ML IV SOLN
INTRAVENOUS | Status: DC | PRN
  Administered 2011-12-18 (×2): 10 ug via INTRAVENOUS
  Administered 2011-12-18: 15 ug via INTRAVENOUS
  Administered 2011-12-18: 5 ug via INTRAVENOUS
  Administered 2011-12-18: 10 ug via INTRAVENOUS
  Administered 2011-12-18: 5 ug via INTRAVENOUS
  Administered 2011-12-18: 15 ug via INTRAVENOUS
  Administered 2011-12-18: 10 ug via INTRAVENOUS

## 2011-12-18 MED ORDER — TISSEEL VH 10 ML EX KIT
PACK | CUTANEOUS | Status: AC
  Filled 2011-12-18: qty 1

## 2011-12-18 MED ORDER — ACETAMINOPHEN 10 MG/ML IV SOLN
INTRAVENOUS | Status: AC
  Filled 2011-12-18: qty 100

## 2011-12-18 MED ORDER — CEFAZOLIN SODIUM 1-5 GM-% IV SOLN
INTRAVENOUS | Status: AC
  Filled 2011-12-18: qty 100

## 2011-12-18 MED ORDER — ACETAMINOPHEN 10 MG/ML IV SOLN
INTRAVENOUS | Status: DC | PRN
  Administered 2011-12-18: 1000 mg via INTRAVENOUS

## 2011-12-18 MED ORDER — CEFAZOLIN SODIUM-DEXTROSE 2-3 GM-% IV SOLR
2.0000 g | INTRAVENOUS | Status: AC
  Administered 2011-12-18: 2 g via INTRAVENOUS

## 2011-12-18 MED ORDER — PROMETHAZINE HCL 25 MG/ML IJ SOLN
INTRAMUSCULAR | Status: AC
  Filled 2011-12-18: qty 1

## 2011-12-18 MED ORDER — BUPIVACAINE LIPOSOME 1.3 % IJ SUSP
20.0000 mL | Freq: Once | INTRAMUSCULAR | Status: AC
  Administered 2011-12-18: 20 mL
  Filled 2011-12-18: qty 20

## 2011-12-18 MED ORDER — HEPARIN SODIUM (PORCINE) 5000 UNIT/ML IJ SOLN
5000.0000 [IU] | Freq: Three times a day (TID) | INTRAMUSCULAR | Status: DC
  Administered 2011-12-18 – 2011-12-21 (×9): 5000 [IU] via SUBCUTANEOUS
  Filled 2011-12-18 (×12): qty 1

## 2011-12-18 MED ORDER — GLYCOPYRROLATE 0.2 MG/ML IJ SOLN
INTRAMUSCULAR | Status: DC | PRN
  Administered 2011-12-18: 0.6 mg via INTRAVENOUS

## 2011-12-18 MED ORDER — HEPARIN SODIUM (PORCINE) 5000 UNIT/ML IJ SOLN
5000.0000 [IU] | Freq: Once | INTRAMUSCULAR | Status: AC
  Administered 2011-12-18: 5000 [IU] via SUBCUTANEOUS
  Filled 2011-12-18: qty 1

## 2011-12-18 MED ORDER — ONDANSETRON HCL 4 MG/2ML IJ SOLN
INTRAMUSCULAR | Status: DC | PRN
  Administered 2011-12-18: 4 mg via INTRAVENOUS

## 2011-12-18 MED ORDER — FLUTICASONE PROPIONATE 50 MCG/ACT NA SUSP: 2.0000 | NASAL | Status: DC | PRN

## 2011-12-18 MED ORDER — DEXAMETHASONE SODIUM PHOSPHATE 10 MG/ML IJ SOLN
INTRAMUSCULAR | Status: DC | PRN
  Administered 2011-12-18: 10 mg via INTRAVENOUS

## 2011-12-18 MED ORDER — FLUTICASONE PROPIONATE 50 MCG/ACT NA SUSP
2.0000 | Freq: Every day | NASAL | Status: DC
  Administered 2011-12-19 – 2011-12-21 (×3): 2 via NASAL
  Filled 2011-12-18: qty 16

## 2011-12-18 MED ORDER — NEOSTIGMINE METHYLSULFATE 1 MG/ML IJ SOLN
INTRAMUSCULAR | Status: DC | PRN
  Administered 2011-12-18: 5 mg via INTRAVENOUS

## 2011-12-18 MED ORDER — AZITHROMYCIN 1 % OP SOLN
1.0000 [drp] | Freq: Every day | OPHTHALMIC | Status: DC
  Filled 2011-12-18 (×8): qty 0.1

## 2011-12-18 MED ORDER — DROPERIDOL 2.5 MG/ML IJ SOLN
INTRAMUSCULAR | Status: DC | PRN
  Administered 2011-12-18: 0.625 mg via INTRAVENOUS

## 2011-12-18 MED ORDER — HYDROMORPHONE HCL PF 1 MG/ML IJ SOLN: 0.2500 mg | INTRAMUSCULAR | Status: DC | PRN

## 2011-12-18 MED ORDER — LACTATED RINGERS IV SOLN
INTRAVENOUS | Status: DC
  Administered 2011-12-18: 14:00:00 via INTRAVENOUS

## 2011-12-18 MED ORDER — OLOPATADINE HCL 0.6 % NA SOLN: 2.0000 | Freq: Every day | NASAL | Status: DC

## 2011-12-18 MED ORDER — CISATRACURIUM BESYLATE (PF) 10 MG/5ML IV SOLN
INTRAVENOUS | Status: DC | PRN
  Administered 2011-12-18: 8 mg via INTRAVENOUS
  Administered 2011-12-18: 2 mg via INTRAVENOUS
  Administered 2011-12-18: 6 mg via INTRAVENOUS

## 2011-12-18 MED ORDER — LIDOCAINE HCL (CARDIAC) 20 MG/ML IV SOLN
INTRAVENOUS | Status: DC | PRN
  Administered 2011-12-18: 75 mg via INTRAVENOUS

## 2011-12-18 MED ORDER — HYDROMORPHONE HCL PF 1 MG/ML IJ SOLN
0.5000 mg | INTRAMUSCULAR | Status: DC | PRN
  Administered 2011-12-18 – 2011-12-19 (×4): 0.5 mg via INTRAVENOUS
  Filled 2011-12-18 (×4): qty 1

## 2011-12-18 MED ORDER — NALOXONE HCL 0.4 MG/ML IJ SOLN
INTRAMUSCULAR | Status: DC | PRN
  Administered 2011-12-18: 120 ug via INTRAVENOUS

## 2011-12-18 MED ORDER — 0.9 % SODIUM CHLORIDE (POUR BTL) OPTIME
TOPICAL | Status: DC | PRN
  Administered 2011-12-18: 1000 mL

## 2011-12-18 SURGICAL SUPPLY — 66 items
ADH SKN CLS APL DERMABOND .7 (GAUZE/BANDAGES/DRESSINGS) ×1
APL SKNCLS STERI-STRIP NONHPOA (GAUZE/BANDAGES/DRESSINGS) ×1
APPLIER CLIP ROT 10 11.4 M/L (STAPLE)
APR CLP MED LRG 11.4X10 (STAPLE)
BENZOIN TINCTURE PRP APPL 2/3 (GAUZE/BANDAGES/DRESSINGS) ×2 IMPLANT
CABLE HIGH FREQUENCY MONO STRZ (ELECTRODE) ×1 IMPLANT
CANISTER SUCTION 2500CC (MISCELLANEOUS) ×1 IMPLANT
CLAMP ENDO BABCK 10MM (STAPLE) IMPLANT
CLIP APPLIE ROT 10 11.4 M/L (STAPLE) IMPLANT
CLOTH BEACON ORANGE TIMEOUT ST (SAFETY) ×2 IMPLANT
COVER SURGICAL LIGHT HANDLE (MISCELLANEOUS) ×2 IMPLANT
DECANTER SPIKE VIAL GLASS SM (MISCELLANEOUS) ×2 IMPLANT
DERMABOND ADVANCED (GAUZE/BANDAGES/DRESSINGS) ×1
DERMABOND ADVANCED .7 DNX12 (GAUZE/BANDAGES/DRESSINGS) IMPLANT
DEVICE SUT QUICK LOAD TK 5 (STAPLE) ×10 IMPLANT
DEVICE SUT TI-KNOT TK 5X26 (MISCELLANEOUS) ×1 IMPLANT
DEVICE SUTURE ENDOST 10MM (ENDOMECHANICALS) ×2 IMPLANT
DISSECTOR BLUNT TIP ENDO 5MM (MISCELLANEOUS) ×2 IMPLANT
DRAIN PENROSE 18X1/2 LTX STRL (DRAIN) ×2 IMPLANT
DRAPE LAPAROSCOPIC ABDOMINAL (DRAPES) ×2 IMPLANT
DUPLOJECT EASY PREP 4ML (MISCELLANEOUS) ×1 IMPLANT
ELECT REM PT RETURN 9FT ADLT (ELECTROSURGICAL) ×2
ELECTRODE REM PT RTRN 9FT ADLT (ELECTROSURGICAL) ×1 IMPLANT
FELT TEFLON 4 X1 (Mesh General) ×2 IMPLANT
FILTER SMOKE EVAC LAPAROSHD (FILTER) IMPLANT
GLOVE BIOGEL M 8.0 STRL (GLOVE) ×2 IMPLANT
GLOVE BIOGEL PI IND STRL 7.0 (GLOVE) IMPLANT
GLOVE BIOGEL PI INDICATOR 7.0 (GLOVE)
GOWN STRL NON-REIN LRG LVL3 (GOWN DISPOSABLE) ×2 IMPLANT
GOWN STRL REIN XL XLG (GOWN DISPOSABLE) ×4 IMPLANT
GRASPER ENDO BABCOCK 10 (MISCELLANEOUS) IMPLANT
GRASPER ENDO BABCOCK 10MM (MISCELLANEOUS)
HAND ACTIVATED (MISCELLANEOUS) ×2 IMPLANT
KIT BASIN OR (CUSTOM PROCEDURE TRAY) ×2 IMPLANT
MESH HERNIA 7X10 (Mesh General) ×1 IMPLANT
NS IRRIG 1000ML POUR BTL (IV SOLUTION) ×2 IMPLANT
PENCIL BUTTON HOLSTER BLD 10FT (ELECTRODE) ×1 IMPLANT
SCISSORS LAP 5X35 DISP (ENDOMECHANICALS) ×2 IMPLANT
SET IRRIG TUBING LAPAROSCOPIC (IRRIGATION / IRRIGATOR) ×2 IMPLANT
SLEEVE ADV FIXATION 5X100MM (TROCAR) IMPLANT
SLEEVE Z-THREAD 5X100MM (TROCAR) IMPLANT
SOLUTION ANTI FOG 6CC (MISCELLANEOUS) ×2 IMPLANT
STAPLER VISISTAT 35W (STAPLE) ×2 IMPLANT
STRIP CLOSURE SKIN 1/2X4 (GAUZE/BANDAGES/DRESSINGS) IMPLANT
SUT ETHIBOND 2 0 SH (SUTURE) ×12
SUT ETHIBOND 2 0 SH 36X2 (SUTURE) IMPLANT
SUT SURGIDAC NAB ES-9 0 48 120 (SUTURE) ×11 IMPLANT
SUT VIC AB 4-0 SH 18 (SUTURE) ×2 IMPLANT
SYR 30ML LL (SYRINGE) ×2 IMPLANT
TIP INNERVISION DETACH 40FR (MISCELLANEOUS) IMPLANT
TIP INNERVISION DETACH 50FR (MISCELLANEOUS) IMPLANT
TIP INNERVISION DETACH 56FR (MISCELLANEOUS) ×1 IMPLANT
TIPS INNERVISION DETACH 40FR (MISCELLANEOUS)
TOWEL OR 17X26 10 PK STRL BLUE (TOWEL DISPOSABLE) ×2 IMPLANT
TOWEL OR NON WOVEN STRL DISP B (DISPOSABLE) ×1 IMPLANT
TRAY FOLEY CATH 14FRSI W/METER (CATHETERS) ×2 IMPLANT
TRAY LAP CHOLE (CUSTOM PROCEDURE TRAY) ×2 IMPLANT
TROCAR ADV FIXATION 11X100MM (TROCAR) IMPLANT
TROCAR ADV FIXATION 5X100MM (TROCAR) ×2 IMPLANT
TROCAR BLADELESS OPT 5 75 (ENDOMECHANICALS) ×2 IMPLANT
TROCAR XCEL BLUNT TIP 100MML (ENDOMECHANICALS) ×1 IMPLANT
TROCAR XCEL NON-BLD 11X100MML (ENDOMECHANICALS) ×1 IMPLANT
TROCAR Z-THREAD FIOS 11X100 BL (TROCAR) ×2 IMPLANT
TROCAR Z-THREAD FIOS 5X100MM (TROCAR) ×2 IMPLANT
TROCAR Z-THREAD SLEEVE 11X100 (TROCAR) IMPLANT
TUBING FILTER THERMOFLATOR (ELECTROSURGICAL) ×2 IMPLANT

## 2011-12-18 NOTE — Progress Notes (Signed)
Patient states she had a cough was instructed by Dr. Ermalene Searing office to get a strep test she did and test was negative.

## 2011-12-18 NOTE — Interval H&P Note (Signed)
History and Physical Interval Note:  12/18/2011 8:25 AM  Jennifer Alvarez  has presented today for surgery, with the diagnosis of recurrent gastroesophageal reflux disease  The various methods of treatment have been discussed with the patient and family. After consideration of risks, benefits and other options for treatment, the patient has consented to  Procedure(s) (LRB): LAPAROSCOPIC NISSEN FUNDOPLICATION (N/A) as a surgical intervention .  The patient's history has been reviewed, patient examined, no change in status, stable for surgery.  I have reviewed the patient's chart and labs.  Questions were answered to the patient's satisfaction.     Gwenette Wellons B  There has been no change in the patient's past medical history or physical exam in the past 24 hours to the best of my knowledge. I examined the patient in the holding area and have made any changes to the history and physical exam report that is included above.   Expectations and outcome results have been discussed with the patient to include risks and benefits.  All questions have been answered and we will proceed with previously discussed procedure noted and signed in the consent form in the patient's record.    Riggs Dineen BMD FACS 8:25 AM  12/18/2011

## 2011-12-18 NOTE — Op Note (Signed)
Surgeon: Wenda Low, MD, FACS  Asst:  Jaclynn Guarneri, MD, FACS  Anes:  general  Procedure: Laparoscopic takedown of herniated Nissen fundoplication [facility] 2002) with repair of hiatus (3 sutures), Glendell Docker biomesh buttressing of the diaphragm, and Nissen fundoplication over a #56 lighted bougie  Diagnosis: Recurrent GERD  Complications: None apparent  EBL:   5 cc  Description of Procedure:  The patient was taken to room 1 at [facility].  General anesthesia was administered and she was prepped with PCMX and draped sterilely. After a timeout was performed access to the abdomen was achieved through the left upper quadrant with a 0 5 mm Optiview without difficulty. Following insufflation a total of 4 more 5 mm trochars were inserted and a single 12 on the inferior side to the right of the midline. The Satira Mccallum was used to retract the liver where the previous wrap could be seen stuck up to the liver. This rapid then performed over no [facility] around 2002 and she developed recurrent symptoms of reflux and upper GI had evidence that her wrap had herniated above the diaphragm.  Sharp dissection with scissors and with the harmonic scalpel were undertaken to free the wrap from the liver and then exposed the right left crus and mobilize this. There were a lot of adhesions and after some tedious dissection we took down all of these adhesions and then went posteriorly to get a window between the right and left crus and enable [place] to place a Penrose drain. With that we also took down the previous wrap cutting the sutures and then unwrapping the stomach. We this had esophageal length and with that had good exposure of the right and left crura and placed a total of 3 posterior sutures the middle piece with pledgets but this approximated the diaphragms nicely. An onlay of SurgAssist by Adriana Simas was then sewn around this diaphragmatic repair and the legs went posterior and came  around behind on the crural repair. Subsequently he was held in place with sutures and Tisseel.  After this a been performed we did the wrap reaching around grabbing fresh stomach bringing around invaginating this over the distal esophagus. This was held in place with 3 sutures using Endo Stitch and Ty knots. The previous diaphragmatic closure would have been done with Endo Stitch and Ty knots and tacking of the suture diaphragm was done with free 20 Surgidek.  The incisions were injected with Exparel. Everything appeared to be in order. The abdomen was deflated and wounds were closed with 4-0 Vicryl with Dermabond. Patient seemed to tolerate procedure well taken recovery in satisfactory condition.  Matt B. Daphine Deutscher, MD, [facility], [place] [phone]

## 2011-12-18 NOTE — Transfer of Care (Signed)
Immediate Anesthesia Transfer of Care Note  Patient: Jennifer Alvarez [facility]  Procedure(s) Performed: Procedure(s) (LRB): LAPAROSCOPIC NISSEN FUNDOPLICATION (N/A) INSERTION OF MESH (N/A)  Patient Location: PACU  Anesthesia Type: General  Level of Consciousness: awake and patient cooperative  Airway & Oxygen Therapy: Patient Spontanous Breathing and Patient connected to face mask oxygen  Post-op Assessment: Report given to PACU RN and Post -op Vital signs reviewed and stable  Post vital signs: Reviewed and stable  Complications: No apparent anesthesia complications

## 2011-12-18 NOTE — Anesthesia Preprocedure Evaluation (Signed)
Anesthesia Evaluation  Patient identified by MRN, date of birth, ID band Patient awake    Reviewed: Allergy & Precautions, H&P , NPO status , Patient's Chart, lab work & pertinent test results  Airway Mallampati: II TM Distance: <3 FB Neck ROM: Full    Dental No notable dental hx.    Pulmonary neg pulmonary ROS,  breath sounds clear to auscultation  Pulmonary exam normal       Cardiovascular negative cardio ROS  Rhythm:Regular Rate:Normal     Neuro/Psych negative neurological ROS  negative psych ROS   GI/Hepatic Neg liver ROS, GERD-  Medicated,  Endo/Other  negative endocrine ROS  Renal/GU negative Renal ROS  negative genitourinary   Musculoskeletal negative musculoskeletal ROS (+)   Abdominal   Peds negative pediatric ROS (+)  Hematology negative hematology ROS (+)   Anesthesia Other Findings   Reproductive/Obstetrics negative OB ROS                           Anesthesia Physical Anesthesia Plan  ASA: II  Anesthesia Plan: General   Post-op Pain Management:    Induction: Intravenous and Rapid sequence  Airway Management Planned: Oral ETT  Additional Equipment:   Intra-op Plan:   Post-operative Plan: Extubation in OR  Informed Consent: I have reviewed the patients History and Physical, chart, labs and discussed the procedure including the risks, benefits and alternatives for the proposed anesthesia with the patient or authorized representative who has indicated his/her understanding and acceptance.   Dental advisory given  Plan Discussed with: CRNA and Surgeon  Anesthesia Plan Comments:         Anesthesia Quick Evaluation

## 2011-12-18 NOTE — Anesthesia Postprocedure Evaluation (Signed)
  Anesthesia Post-op Note  Patient: Jennifer Alvarez [facility]  Procedure(s) Performed: Procedure(s) (LRB): LAPAROSCOPIC NISSEN FUNDOPLICATION (N/A) INSERTION OF MESH (N/A)  Patient Location: PACU  Anesthesia Type: General  Level of Consciousness: awake and alert   Airway and Oxygen Therapy: Patient Spontanous Breathing  Post-op Pain: mild  Post-op Assessment: Post-op Vital signs reviewed, Patient's Cardiovascular Status Stable, Respiratory Function Stable, Patent Airway and No signs of Nausea or vomiting  Post-op Vital Signs: stable  Complications: No apparent anesthesia complications

## 2011-12-19 ENCOUNTER — Encounter (HOSPITAL_COMMUNITY): Payer: Self-pay | Admitting: Surgery

## 2011-12-19 ENCOUNTER — Inpatient Hospital Stay (HOSPITAL_COMMUNITY): Payer: BC Managed Care – PPO

## 2011-12-19 LAB — CBC WITH DIFFERENTIAL/PLATELET
Basophils Absolute: 0 10*3/uL (ref 0.0–0.1)
HCT: 32.5 % — ABNORMAL LOW (ref 36.0–46.0)
Hemoglobin: 10.9 g/dL — ABNORMAL LOW (ref 12.0–15.0)
Lymphocytes Relative: 6 % — ABNORMAL LOW (ref 12–46)
Monocytes Absolute: 1.4 10*3/uL — ABNORMAL HIGH (ref 0.1–1.0)
Monocytes Relative: 11 % (ref 3–12)
Neutro Abs: 10.5 10*3/uL — ABNORMAL HIGH (ref 1.7–7.7)
RBC: 4.05 MIL/uL (ref 3.87–5.11)
WBC: 12.7 10*3/uL — ABNORMAL HIGH (ref 4.0–10.5)

## 2011-12-19 MED ORDER — LORATADINE 10 MG PO TABS
10.0000 mg | ORAL_TABLET | Freq: Every day | ORAL | Status: DC
  Administered 2011-12-19 – 2011-12-21 (×3): 10 mg via ORAL
  Filled 2011-12-19 (×4): qty 1

## 2011-12-19 MED ORDER — ACETAMINOPHEN 160 MG/5ML PO SOLN
650.0000 mg | ORAL | Status: DC | PRN
  Administered 2011-12-19 – 2011-12-20 (×3): 650 mg via ORAL
  Filled 2011-12-19 (×3): qty 20.3

## 2011-12-19 NOTE — Progress Notes (Signed)
Patient ID: Jennifer Alvarez, female   DOB: 07/16/1950, 61 y.o.   MRN: [id number] [facility] Progress Note:   1 Day Post-Op  Subjective: Mental status is clear.   Objective: Vital signs in last 24 hours: Temp:  [97.7 F (36.5 C)-98.8 F (37.1 C)] 98.8 F (37.1 C) (08/22 0537) Pulse Rate:  [73-115] 74  (08/22 0537) Resp:  [9-20] 18  (08/22 0537) BP: (132-162)/(60-91) 142/65 mmHg (08/22 0537) SpO2:  [94 %-100 %] 99 % (08/22 0537) Weight:  [134 lb (60.782 kg)] 134 lb (60.782 kg) (08/21 1426)  Intake/Output from previous day: 08/21 0701 - 08/22 0700 In: 4395 [I.V.:4395] Out: 2250 [Urine:2250] Intake/Output this shift:    Physical Exam: Work of breathing is  Normal.  Has been up walking. Reports some dry heaves last night.  UGI pending.    Lab Results:  Results for orders placed during the hospital encounter of 12/18/11 (from the past 48 hour(s))  CBC     Status: Abnormal   Collection Time   12/18/11  2:55 PM      Component Value Range Comment   WBC 13.1 (*) 4.0 - 10.5 K/uL    RBC 4.30  3.87 - 5.11 MIL/uL    Hemoglobin 11.4 (*) 12.0 - 15.0 g/dL    HCT 40.9 (*) 81.1 - 46.0 %    MCV 80.9  78.0 - 100.0 fL    MCH 26.5  26.0 - 34.0 pg    MCHC 32.8  30.0 - 36.0 g/dL    RDW 91.4  78.2 - 95.6 %    Platelets 265  150 - 400 K/uL   CREATININE, SERUM     Status: Abnormal   Collection Time   12/18/11  2:55 PM      Component Value Range Comment   Creatinine, Ser 0.78  0.50 - 1.10 mg/dL    GFR calc non Af Amer 88 (*) >90 mL/min    GFR calc Af Amer >90  >90 mL/min   CBC WITH DIFFERENTIAL     Status: Abnormal   Collection Time   12/19/11  4:24 AM      Component Value Range Comment   WBC 12.7 (*) 4.0 - 10.5 K/uL    RBC 4.05  3.87 - 5.11 MIL/uL    Hemoglobin 10.9 (*) 12.0 - 15.0 g/dL    HCT 21.3 (*) 08.6 - 46.0 %    MCV 80.2  78.0 - 100.0 fL    MCH 26.9  26.0 - 34.0 pg    MCHC 33.5  30.0 - 36.0 g/dL    RDW 57.8  46.9 - 62.9 %    Platelets 286  150 - 400 K/uL    Neutrophils  Relative 83 (*) 43 - 77 %    Neutro Abs 10.5 (*) 1.7 - 7.7 K/uL    Lymphocytes Relative 6 (*) 12 - 46 %    Lymphs Abs 0.8  0.7 - 4.0 K/uL    Monocytes Relative 11  3 - 12 %    Monocytes Absolute 1.4 (*) 0.1 - 1.0 K/uL    Eosinophils Relative 0  0 - 5 %    Eosinophils Absolute 0.0  0.0 - 0.7 K/uL    Basophils Relative 0  0 - 1 %    Basophils Absolute 0.0  0.0 - 0.1 K/uL     Radiology/Results: No results found.  Anti-infectives: Anti-infectives     Start     Dose/Rate Route Frequency Ordered Stop   12/18/11 [phone]  ceFAZolin (ANCEF) IVPB 2 g/50 mL premix        2 g 100 mL/hr over 30 Minutes Intravenous 60 min pre-op 12/18/11 8657 12/18/11 0842          Assessment/Plan: Problem List: Patient Active Problem List  Diagnosis  . GERD (gastroesophageal reflux disease)    Dry heaves last night hopefully didn't disrupt wrap.  VSS.  Await UGI before starting H20.  1 Day Post-Op    LOS: 1 day   Matt B. Daphine Deutscher, MD, [facility] [phone] beeper [phone]  12/19/2011 7:44 AM

## 2011-12-19 NOTE — Progress Notes (Signed)
Dr. Gerrit Friends aware via phone pt needing analgesic for headache. MD made aware of UGI results from today. New order received for Tylenol elixir.

## 2011-12-19 NOTE — Care Management Note (Signed)
    Page 1 of 1   12/23/2011     8:35:44 AM   CARE MANAGEMENT NOTE 12/23/2011  Patient:  Jennifer Alvarez, Jennifer Alvarez   Account Number:  [id number]  Date Initiated:  12/19/2011  Documentation initiated by:  Lorenda Ishihara  Subjective/Objective Assessment:   61 yo female admitted s/p redo nissen fundoplication. PTA lived at home with spouse.     Action/Plan:   Anticipated DC Date:  12/21/2011   Anticipated DC Plan:  HOME/SELF CARE      DC Planning Services  CM consult      Choice offered to / List presented to:             Status of service:  Completed, signed off Medicare Important Message given?   (If response is "NO", the following Medicare IM given date fields will be blank) Date Medicare IM given:   Date Additional Medicare IM given:    Discharge Disposition:  HOME/SELF CARE  Per UR Regulation:  Reviewed for med. necessity/level of care/duration of stay  If discussed at Long Length of Stay Meetings, dates discussed:    Comments:

## 2011-12-20 LAB — CBC WITH DIFFERENTIAL/PLATELET
Basophils Relative: 0 % (ref 0–1)
HCT: 33.8 % — ABNORMAL LOW (ref 36.0–46.0)
Hemoglobin: 10.8 g/dL — ABNORMAL LOW (ref 12.0–15.0)
MCH: 26.1 pg (ref 26.0–34.0)
MCHC: 32 g/dL (ref 30.0–36.0)
Monocytes Absolute: 0.9 10*3/uL (ref 0.1–1.0)
Monocytes Relative: 10 % (ref 3–12)
Neutro Abs: 6.5 10*3/uL (ref 1.7–7.7)

## 2011-12-20 NOTE — Progress Notes (Signed)
Patient ID: Jennifer Alvarez, female   DOB: May 06, 1950, 61 y.o.   MRN: [id number] [facility] Progress Note:   2 Days Post-Op  Subjective: Mental status is clear Objective: Vital signs in last 24 hours: Temp:  [98.3 F (36.8 C)-99.7 F (37.6 C)] 99.2 F (37.3 C) (08/23 0559) Pulse Rate:  [84-90] 85  (08/23 0559) Resp:  [18] 18  (08/23 0559) BP: (130-135)/(59-74) 131/63 mmHg (08/23 0559) SpO2:  [93 %-100 %] 96 % (08/23 0559)  Intake/Output from previous day: 08/22 0701 - 08/23 0700 In: 2403.3 [I.V.:2403.3] Out: 3850 [Urine:3850] Intake/Output this shift: Total I/O In: 120 [P.O.:120] Out: 900 [Urine:900]  Physical Exam: Work of breathing is  Normal.  Up walking Nausea is less.  Continue clears today  Lab Results:  Results for orders placed during the hospital encounter of 12/18/11 (from the past 48 hour(s))  CBC     Status: Abnormal   Collection Time   12/18/11  2:55 PM      Component Value Range Comment   WBC 13.1 (*) 4.0 - 10.5 K/uL    RBC 4.30  3.87 - 5.11 MIL/uL    Hemoglobin 11.4 (*) 12.0 - 15.0 g/dL    HCT 40.9 (*) 81.1 - 46.0 %    MCV 80.9  78.0 - 100.0 fL    MCH 26.5  26.0 - 34.0 pg    MCHC 32.8  30.0 - 36.0 g/dL    RDW 91.4  78.2 - 95.6 %    Platelets 265  150 - 400 K/uL   CREATININE, SERUM     Status: Abnormal   Collection Time   12/18/11  2:55 PM      Component Value Range Comment   Creatinine, Ser 0.78  0.50 - 1.10 mg/dL    GFR calc non Af Amer 88 (*) >90 mL/min    GFR calc Af Amer >90  >90 mL/min   CBC WITH DIFFERENTIAL     Status: Abnormal   Collection Time   12/19/11  4:24 AM      Component Value Range Comment   WBC 12.7 (*) 4.0 - 10.5 K/uL    RBC 4.05  3.87 - 5.11 MIL/uL    Hemoglobin 10.9 (*) 12.0 - 15.0 g/dL    HCT 21.3 (*) 08.6 - 46.0 %    MCV 80.2  78.0 - 100.0 fL    MCH 26.9  26.0 - 34.0 pg    MCHC 33.5  30.0 - 36.0 g/dL    RDW 57.8  46.9 - 62.9 %    Platelets 286  150 - 400 K/uL    Neutrophils Relative 83 (*) 43 - 77 %    Neutro  Abs 10.5 (*) 1.7 - 7.7 K/uL    Lymphocytes Relative 6 (*) 12 - 46 %    Lymphs Abs 0.8  0.7 - 4.0 K/uL    Monocytes Relative 11  3 - 12 %    Monocytes Absolute 1.4 (*) 0.1 - 1.0 K/uL    Eosinophils Relative 0  0 - 5 %    Eosinophils Absolute 0.0  0.0 - 0.7 K/uL    Basophils Relative 0  0 - 1 %    Basophils Absolute 0.0  0.0 - 0.1 K/uL   CBC WITH DIFFERENTIAL     Status: Abnormal   Collection Time   12/20/11  3:53 AM      Component Value Range Comment   WBC 8.5  4.0 - 10.5 K/uL    RBC  4.14  3.87 - 5.11 MIL/uL    Hemoglobin 10.8 (*) 12.0 - 15.0 g/dL    HCT 40.9 (*) 81.1 - 46.0 %    MCV 81.6  78.0 - 100.0 fL    MCH 26.1  26.0 - 34.0 pg    MCHC 32.0  30.0 - 36.0 g/dL    RDW 91.4  78.2 - 95.6 %    Platelets 254  150 - 400 K/uL    Neutrophils Relative 76  43 - 77 %    Neutro Abs 6.5  1.7 - 7.7 K/uL    Lymphocytes Relative 13  12 - 46 %    Lymphs Abs 1.1  0.7 - 4.0 K/uL    Monocytes Relative 10  3 - 12 %    Monocytes Absolute 0.9  0.1 - 1.0 K/uL    Eosinophils Relative 0  0 - 5 %    Eosinophils Absolute 0.0  0.0 - 0.7 K/uL    Basophils Relative 0  0 - 1 %    Basophils Absolute 0.0  0.0 - 0.1 K/uL     Radiology/Results: Dg Ugi W/water Sol Cm  12/19/2011  *RADIOLOGY REPORT*  Clinical Data: Status post Nissen fundoplication.  WATER SOLUBLE ESOPHAGRAM  Technique: Omnipaque 300 was utilized for several swallows and frontal and oblique positioning to assess the distal esophagus and gastroesophageal junction.  Fluoro time was 1 minute and 15 seconds.  Comparison:  09/18/2011  Findings: No recurrent hiatal hernia noted.  Expected tapering of the contrast column is visualized in the vicinity of the wrap, with maximum distention in the area of the wrap at about 3 mm diameter. No leak observed.  IMPRESSION:  1.  Nissen fundoplication in place, with maximum luminal diameter in the vicinity of the wrap at about 3 mm currently.  No leak/extravasation identified.  No recurrent hiatal hernia.   Original  Report Authenticated By: Dellia Cloud, M.D.     Anti-infectives: Anti-infectives     Start     Dose/Rate Route Frequency Ordered Stop   12/18/11 0634   ceFAZolin (ANCEF) IVPB 2 g/50 mL premix        2 g 100 mL/hr over 30 Minutes Intravenous 60 min pre-op 12/18/11 2130 12/18/11 0842          Assessment/Plan: Problem List: Patient Active Problem List  Diagnosis  . GERD (gastroesophageal reflux disease)    Hopeful discharge tomorrow.   2 Days Post-Op    LOS: 2 days   Matt B. Daphine Deutscher, MD, [facility] [phone] beeper [phone]  12/20/2011 10:14 AM

## 2011-12-21 MED ORDER — OXYCODONE HCL 5 MG/5ML PO SOLN: 5.0000 mg | ORAL | Status: DC | PRN

## 2011-12-21 MED ORDER — ONDANSETRON 4 MG PO TBDP: 4.0000 mg | ORAL_TABLET | Freq: Three times a day (TID) | ORAL | Status: AC | PRN

## 2011-12-21 NOTE — Progress Notes (Signed)
3 Days Post-Op  Subjective: No pain.  Tolerating clear liquids. Asking to go home  Objective: Vital signs in last 24 hours: Temp:  [97.5 F (36.4 C)-99.1 F (37.3 C)] 97.5 F (36.4 C) (08/24 1000) Pulse Rate:  [77-89] 77  (08/24 1000) Resp:  [16-18] 16  (08/24 1000) BP: (123-152)/(62-84) 123/76 mmHg (08/24 1000) SpO2:  [97 %] 97 % (08/24 1000) Last BM Date: 12/17/11  Intake/Output from previous day: 08/23 0701 - 08/24 0700 In: 1882.5 [P.O.:480; I.V.:1402.5] Out: 2800 [Urine:2800] Intake/Output this shift: Total I/O In: -  Out: 300 [Urine:300]  General appearance: alert, cooperative and no distress Resp: nonlabored Cardio: normal rate, regular GI: soft, NT, ND, wounds okay, no peritonitis  Lab Results:   [facility] 12/20/11 0353 12/19/11 0424  WBC 8.5 12.7*  HGB 10.8* 10.9*  HCT 33.8* 32.5*  PLT 254 286   BMET  Basename 12/18/11 1455  NA --  K --  CL --  CO2 --  GLUCOSE --  BUN --  CREATININE 0.78  CALCIUM --   PT/INR No results found for this basename: LABPROT:2,INR:2 in the last 72 hours ABG No results found for this basename: PHART:2,PCO2:2,PO2:2,HCO3:2 in the last 72 hours  Studies/Results: No results found.  Anti-infectives: Anti-infectives     Start     Dose/Rate Route Frequency Ordered Stop   12/18/11 0634   ceFAZolin (ANCEF) IVPB 2 g/50 mL premix        2 g 100 mL/hr over 30 Minutes Intravenous 60 min pre-op 12/18/11 0634 12/18/11 0842          Assessment/Plan: s/p Procedure(s) (LRB): LAPAROSCOPIC NISSEN FUNDOPLICATION (N/A) INSERTION OF MESH (N/A) full liquids and should be okay for discharge later today if still tolerating diet. She looks good and no evidence of postop complication   LOS: 3 days    Lodema Pilot DAVID 12/21/2011

## 2011-12-21 NOTE — Progress Notes (Signed)
Pt discharged to home with provided discharge instructions and prescriptions along with handouts. Pt verbalized understanding of discharge information. Pt stable. Pt transported by [facility] IV removed and documented. Annitta Needs, RN

## 2011-12-26 ENCOUNTER — Telehealth (INDEPENDENT_AMBULATORY_CARE_PROVIDER_SITE_OTHER): Payer: Self-pay

## 2011-12-26 NOTE — Telephone Encounter (Signed)
Pt called in wanting to know some guide lines after surgery done a few weeks ago with a Redo Lap Nissen Repair. The pt wanted to know about driving and I adv. Pt that as long as she is not on any narcotics it's ok to drive. The pt wanted to know about doing her yoga exercises on the game system and I adv. It's ok to do yoga as long as it doesn't hurt then ok to do. I advise pt just to be careful with some of the poses for yoga with the stretching of the upper body b/c she will be sore from surgery. The pt wanted to know about going back to work and I adv. Pt that it's really up to her about going to work as long as she is not doing any heavy lifting,pushing,or pulling over 10lbs. The pt adv.to call if she has any more questions.

## 2011-12-27 ENCOUNTER — Telehealth (INDEPENDENT_AMBULATORY_CARE_PROVIDER_SITE_OTHER): Payer: Self-pay | Admitting: *Deleted

## 2011-12-27 NOTE — Telephone Encounter (Signed)
Spoke with patient and was told that she call yesterday and was informed by Elease Hashimoto as to what she can and can't do.

## 2011-12-27 NOTE — Telephone Encounter (Signed)
Message copied by Barrie Dunker on Fri Dec 27, 2011  1:18 PM ------      Message from: [place], [place]      Created: Fri Dec 27, 2011 10:47 AM      Regarding: FW: Clarity      Contact: [phone]                   ----- Message -----         From: Marlowe Aschoff, RN         Sent: 12/27/2011  10:43 AM           To: Valarie Merino, MD, Liliana Cline, CMA      Subject: FW: Clarity                                              Please advise.  Thanks,      Deanna      ----- Message -----         From: Cathi Roan         Sent: 12/25/2011   9:40 AM           To: Marlowe Aschoff, RN      Subject: Clarity                                                  Would like clarity on what she can and can't do, between now and post op appt.

## 2011-12-31 ENCOUNTER — Telehealth (INDEPENDENT_AMBULATORY_CARE_PROVIDER_SITE_OTHER): Payer: Self-pay | Admitting: General Surgery

## 2011-12-31 NOTE — Telephone Encounter (Signed)
Message copied by Liliana Cline on Tue Dec 31, 2011  3:07 PM ------      Message from: Lenise Herald      Created: Tue Dec 31, 2011  2:41 PM       Please see below. Thanks.            ----- Message -----         From: Zacarias Pontes         Sent: 12/31/2011   2:36 PM           To: Lenise Herald, CMA, Dannette Barbara, CMA            Pt would like a RTW note for 01/06/12 she would like to work half days until being seen here again on the 11th and then determine how long on half days.She sits down all day so she doesn't need any lifting restrictions.you may call her at [phone]....the patient will pick up RTW note

## 2011-12-31 NOTE — Telephone Encounter (Signed)
Note written and at the front. Left message for patient to call back.

## 2012-01-01 ENCOUNTER — Telehealth (INDEPENDENT_AMBULATORY_CARE_PROVIDER_SITE_OTHER): Payer: Self-pay | Admitting: General Surgery

## 2012-01-01 ENCOUNTER — Encounter (INDEPENDENT_AMBULATORY_CARE_PROVIDER_SITE_OTHER): Payer: Self-pay | Admitting: General Surgery

## 2012-01-01 NOTE — Telephone Encounter (Signed)
RTW note faxed to [phone] attn: Clydie Braun per patient request.

## 2012-01-07 NOTE — Discharge Summary (Signed)
Physician Discharge Summary  Patient ID: Jennifer Alvarez MRN: [id number] DOB/AGE: May 31, 1950 61 y.o.  Admit date: 12/18/2011 Discharge date: 12/20/11  Admission Diagnoses: recurrent hiatus hernia  Discharge Diagnoses:  SAMEActive Problems:  * No active hospital problems. *    Surgery:  Laparoscopic takedown of herniated Nissen fundoplication [facility] 2002) with repair of hiatus (3 sutures), Glendell Docker biomesh buttressing of the diaphragm, and Nissen fundoplication over a #56 lighted bougie   Discharged Condition: improved  Hospital Course:   Had surgery; on postop day one had UGI which looked ok.  Diet slowly advance to liquids and then ready for discharge  Consults: none  Significant Diagnostic Studies:none    Discharge Exam: Blood pressure 121/74, pulse 90, temperature 98.3 F (36.8 C), temperature source Oral, resp. rate 18, height 4\' 11"  (1.499 m), weight 134 lb (60.782 kg), SpO2 99.00%. Abdomen nontender  Disposition: 01-Home or Self Care  Discharge Orders    Future Appointments: Provider: Department: Dept Phone: Center:   01/08/2012 2:30 PM Valarie Merino, MD [facility] [place] [phone] None     Future Orders Please Complete By Expires   Increase activity slowly      Discharge instructions      Comments:   May shower. Full liquid diet for 1 week, then pureed diet for 4 weeks. Follow up with Dr. Daphine Deutscher in 2 weeks, [phone]   Call MD for:  temperature >100.4      Call MD for:  persistant nausea and vomiting      Call MD for:  severe uncontrolled pain      Call MD for:  difficulty breathing, headache or visual disturbances        Medication List  As of 01/07/2012  7:01 AM   STOP taking these medications         dexlansoprazole 60 MG capsule         TAKE these medications         aspirin EC 81 MG tablet   Take 81 mg by mouth daily.      azithromycin 1 % ophthalmic solution   Commonly known as: AZASITE   Place 1 drop into both eyes at bedtime.     Calcium-Vitamin D-Vitamin K 500-100-40 MG-UNT-MCG Chew   Chew 2-3 tablets by mouth daily.      cetirizine 10 MG tablet   Commonly known as: ZYRTEC   Take 10 mg by mouth daily.      fluticasone 50 MCG/ACT nasal spray   Commonly known as: FLONASE   Place 2 sprays into the nose as needed.      moxifloxacin 0.5 % ophthalmic solution   Commonly known as: VIGAMOX   Place 1 drop into the right eye 4 (four) times daily as needed. conjuctivitis      oxyCODONE 5 MG/5ML solution   Commonly known as: ROXICODONE   Take 5 mLs (5 mg total) by mouth every 4 (four) hours as needed for pain.      PATANASE 0.6 % Soln   Generic drug: Olopatadine HCl   Place 2 puffs into both nostrils daily. As directed      PRESCRIPTION MEDICATION   Tree serum injection at the allergy Dr office to improve tolerance of tree pollen             Signed: Karolyn Messing B 01/07/2012, 7:01 AM

## 2012-01-08 ENCOUNTER — Ambulatory Visit (INDEPENDENT_AMBULATORY_CARE_PROVIDER_SITE_OTHER): Payer: BC Managed Care – PPO | Admitting: Surgery

## 2012-01-08 ENCOUNTER — Encounter (INDEPENDENT_AMBULATORY_CARE_PROVIDER_SITE_OTHER): Payer: Self-pay | Admitting: Surgery

## 2012-01-08 VITALS — BP 144/92 | HR 81 | Temp 97.8°F | Ht 59.0 in | Wt 128.8 lb

## 2012-01-08 DIAGNOSIS — Z09 Encounter for follow-up examination after completed treatment for conditions other than malignant neoplasm: Secondary | ICD-10-CM

## 2012-01-08 DIAGNOSIS — Z9889 Other specified postprocedural states: Secondary | ICD-10-CM

## 2012-01-08 NOTE — Patient Instructions (Addendum)
Would not try swimming or horseback riding or yoga until 6 weeks post surgery.  Stretching OK.  Floating in pool [place].  Watching horses OK.    When advancing diet, avoid microwaving foods as this can dry them out and produce dysphagia.   Try warm tea for esophageal spasm.

## 2012-01-08 NOTE — Progress Notes (Signed)
Jennifer Alvarez 61 y.o.  Body mass index is 26.01 kg/(m^2).  Patient Active Problem List  Diagnosis  . GERD (gastroesophageal reflux disease)    Allergies  Allergen Reactions  . Bee Venom Anaphylaxis  . Other Swelling    Tree pollen Congestion, watery eyes, swelling to face /eyes  . Peanut-Containing Drug Products Swelling    Itching around mouth and throat  . Vicodin (Hydrocodone-Acetaminophen) Nausea And Vomiting  . Celebrex (Celecoxib) Palpitations  . Demerol Rash  . Valium Rash    Past Surgical History  Procedure Date  . Bunionectomy     right foot  . Rhinoplasty   . Pelvic laparoscopy   . Bladder repair   . Nissen fundoplication 2002  . Abdominal hysterectomy   . Colonoscopy   . Laparoscopic nissen fundoplication 12/18/2011    Procedure: LAPAROSCOPIC NISSEN FUNDOPLICATION;  Surgeon: Valarie Merino, MD;  Location: [facility];  Service: General;  Laterality: N/A;  Redo Lap Nissen Fundoplication takedown of herniated nissen fundoplication redo hiatal hernia repair   BURNETT,BRENT A, MD No diagnosis found.  Doing well.  Having some esophageal spasm.  I think that she is wanting to advance her diet too quickly but will try to go more  Slowly. Will see her back in 2 months.  She will tell us when she is ready to go from half days back to full days.  Matt B. Daphine Deutscher, MD, [facility] [phone] beeper [phone]  01/08/2012 4:43 PM

## 2012-01-10 ENCOUNTER — Telehealth (INDEPENDENT_AMBULATORY_CARE_PROVIDER_SITE_OTHER): Payer: Self-pay | Admitting: General Surgery

## 2012-01-10 NOTE — Telephone Encounter (Signed)
MS Bagdasarian CALLED TO REQUEST A R-T-W NOTE FOR FULL DUTY ON 01-27-12 AND TO ASK IF SHE COULD TRY SOME RICE  CRISPY CEREAL? I FAXED R-T-W FORM TO Tomasa Rand [phone]. I REVIEWED REQ FOR CEREAL WITH DR. MARTIN. DR. Sallyanne Havers SAID OK FOR RICE KRISPY AFTER IT SITS IN MILK AND SOFTENS/ PT NOTIFIED/GY

## 2012-01-27 ENCOUNTER — Telehealth: Payer: Self-pay | Admitting: Internal Medicine

## 2012-01-27 NOTE — Telephone Encounter (Signed)
Soft diet and  Antireflux diet.

## 2012-01-27 NOTE — Telephone Encounter (Signed)
Patient calling to ask about diet post redo of Nissen fundoplication on 01/18/12. She has been on a puree diet. She states Dr. Daphine Deutscher told her it would be 6 weeks of healing. She states when she saw him on 01/08/12, he was very vague about when/how to advance her diet. She is wondering if Dr. Juanda Chance has any suggestions. Please, advise.

## 2012-01-28 ENCOUNTER — Telehealth (INDEPENDENT_AMBULATORY_CARE_PROVIDER_SITE_OTHER): Payer: Self-pay | Admitting: General Surgery

## 2012-01-28 NOTE — Telephone Encounter (Signed)
Message copied by Littie Deeds on Tue Jan 28, 2012  2:11 PM ------      Message from: Marlowe Aschoff      Created: Tue Jan 28, 2012  1:19 PM      Contact: [phone]                   ----- Message -----         From: Marnette Burgess         Sent: 01/28/2012   1:02 PM           To: Valarie Merino, MD, Marlowe Aschoff, RN            Patient would like a return phone call on diet recommendations after her surgery, please call.

## 2012-01-28 NOTE — Telephone Encounter (Signed)
Spoke with pt and informed her that she can eat foods that are well-cooked including:  Slow cooked meats like chicken and beef, moist carbohydrates as well as potatoes and white rice, soft cheeses, cooked/ canned vegetables.  Informed her she needs to stay away from acidic foods and instructed her she needs to do small bites and to chew well.  Also informed her to stay away from caffeine and carbonated beverages.  She found this to be very helpful and she informed me she is seeing her nutritionist tomorrow.

## 2012-01-28 NOTE — Telephone Encounter (Signed)
Spoke with patient and gave her Dr. Regino Schultze recommendations. She will pick up information on antireflux diet today.

## 2012-01-31 ENCOUNTER — Telehealth (INDEPENDENT_AMBULATORY_CARE_PROVIDER_SITE_OTHER): Payer: Self-pay

## 2012-01-31 ENCOUNTER — Telehealth (INDEPENDENT_AMBULATORY_CARE_PROVIDER_SITE_OTHER): Payer: Self-pay | Admitting: General Surgery

## 2012-01-31 NOTE — Telephone Encounter (Signed)
Called back patient based on message left earlier today. Advised the 24 steps would not be a problem as long as they are not large or have strong incline. Patient stated they are "regular steps" and it is only that one flight of them at work. Patient also stated she has started a walking program at work and that exercise bands are offered to be used. Advised her to wait until she has seen and obtained approval from Dr. Daphine Deutscher before she starts using them. Patient agreed.

## 2012-01-31 NOTE — Telephone Encounter (Signed)
The patient called and states the elevator at her work will not go up to her floor but the floor below and then she has to take the stairs.  There are about 24 steps to go up and down a few times a day.  She wants to make sure she is ok to do that since she is recovering from her lap nissen redo on 8/21.  Please advise.

## 2012-02-14 ENCOUNTER — Telehealth (INDEPENDENT_AMBULATORY_CARE_PROVIDER_SITE_OTHER): Payer: Self-pay

## 2012-02-14 NOTE — Telephone Encounter (Signed)
The patient called because her follow up appointment was moved out to 11/21.  Surgery was 8/21 for nissen fundiplocation.  She would like some guidance on her diet as far as does she need to stay on a soft mushy diet.  Also regarding exercise, she has only been walking and she wants to know what she can do now.  She does a walking group that also uses stretchy bands and she wants to try that.

## 2012-03-06 ENCOUNTER — Encounter (INDEPENDENT_AMBULATORY_CARE_PROVIDER_SITE_OTHER): Payer: BC Managed Care – PPO | Admitting: Surgery

## 2012-03-18 ENCOUNTER — Encounter (INDEPENDENT_AMBULATORY_CARE_PROVIDER_SITE_OTHER): Payer: BC Managed Care – PPO | Admitting: Surgery

## 2012-03-19 ENCOUNTER — Ambulatory Visit (INDEPENDENT_AMBULATORY_CARE_PROVIDER_SITE_OTHER): Payer: BC Managed Care – PPO | Admitting: Surgery

## 2012-03-19 ENCOUNTER — Encounter (INDEPENDENT_AMBULATORY_CARE_PROVIDER_SITE_OTHER): Payer: Self-pay | Admitting: Surgery

## 2012-03-19 VITALS — BP 100/70 | HR 72 | Temp 97.0°F | Ht 59.0 in | Wt 127.0 lb

## 2012-03-19 DIAGNOSIS — Z9889 Other specified postprocedural states: Secondary | ICD-10-CM

## 2012-03-19 DIAGNOSIS — Z09 Encounter for follow-up examination after completed treatment for conditions other than malignant neoplasm: Secondary | ICD-10-CM

## 2012-03-19 NOTE — Progress Notes (Signed)
Franceen Erisman Sox 61 y.o.  Body mass index is 25.65 kg/(m^2).  Patient Active Problem List  Diagnosis  . GERD (gastroesophageal reflux disease)  . Post redo Nissen fundoplication August 2013    Allergies  Allergen Reactions  . Bee Venom Anaphylaxis  . Other Swelling    Tree pollen Congestion, watery eyes, swelling to face /eyes  . Peanut-Containing Drug Products Swelling    Itching around mouth and throat  . Vicodin (Hydrocodone-Acetaminophen) Nausea And Vomiting  . Celebrex (Celecoxib) Palpitations  . Demerol Rash  . Valium Rash    Past Surgical History  Procedure Date  . Bunionectomy     right foot  . Rhinoplasty   . Pelvic laparoscopy   . Bladder repair   . Nissen fundoplication 2002  . Abdominal hysterectomy   . Colonoscopy   . Laparoscopic nissen fundoplication 12/18/2011    Procedure: LAPAROSCOPIC NISSEN FUNDOPLICATION;  Surgeon: Valarie Merino, MD;  Location: [facility];  Service: General;  Laterality: N/A;  Redo Lap Nissen Fundoplication takedown of herniated nissen fundoplication redo hiatal hernia repair   BURNETT,BRENT A, MD No diagnosis found.  Has a little lump in her throat occasionally.  No GERD.  HH was repaired with [place].  Will recheck in 6 months to make sure she is better.  She seems otherwise happy with her symptom resolution Matt B. Daphine Deutscher, MD, [facility] [phone] beeper [phone]  03/19/2012 12:18 PM

## 2012-03-19 NOTE — Patient Instructions (Signed)
Thanks for your patience.  If you need further assistance after leaving the office, please call our office and speak with a [facility] nurse.  [phone].  If you want to leave a message for Dr. Quanda Pavlicek, please call his office phone at [phone]. 

## 2012-08-27 ENCOUNTER — Telehealth: Payer: Self-pay | Admitting: Internal Medicine

## 2012-08-27 NOTE — Telephone Encounter (Signed)
Patient report RUQ pain.  She will come in and see Doug Sou, PA tomorrow at 8:30

## 2012-08-28 ENCOUNTER — Encounter: Payer: Self-pay | Admitting: Gastroenterology

## 2012-08-28 ENCOUNTER — Ambulatory Visit (INDEPENDENT_AMBULATORY_CARE_PROVIDER_SITE_OTHER): Payer: BC Managed Care – PPO | Admitting: Gastroenterology

## 2012-08-28 ENCOUNTER — Telehealth: Payer: Self-pay

## 2012-08-28 VITALS — BP 130/80 | HR 66 | Ht 59.25 in | Wt 126.0 lb

## 2012-08-28 DIAGNOSIS — R1011 Right upper quadrant pain: Secondary | ICD-10-CM

## 2012-08-28 DIAGNOSIS — G8929 Other chronic pain: Secondary | ICD-10-CM

## 2012-08-28 DIAGNOSIS — R0789 Other chest pain: Secondary | ICD-10-CM

## 2012-08-28 MED ORDER — OMEPRAZOLE 20 MG PO CPDR: 20.0000 mg | DELAYED_RELEASE_CAPSULE | Freq: Two times a day (BID) | ORAL | Status: DC

## 2012-08-28 MED ORDER — OMEPRAZOLE 20 MG PO CPDR: 20.0000 mg | DELAYED_RELEASE_CAPSULE | Freq: Every day | ORAL | Status: DC

## 2012-08-28 NOTE — Progress Notes (Signed)
08/28/2012 Jennifer Alvarez [id number] 1951/04/17   History of Present Illness: Patient is a 62 year old female who is a patient of Dr. Regino Schultze.  She presents to our office today with several complaints.  Has history of GERD and Barrett's s/p Nissen fundoplication x 2, mos recently in 11/2011.  At first she did well, but now she says that she is having discomfort in her RUQ when she is lying on that side at night.  Pain comes and goes in RUQ during the day at times as well.  Also has pain in her chest when she stretches and takes deep breaths for the past month or so.  She seems to think that this is all related to her reflux issues again.  Ultrasound of her abdomen was performed in August prior to her surgery and was normal.  EGD by Dr. Juanda Chance in 08/2011 showed a hiatal hernia and a Nissen that had become undone.  She says that she just "hurts all the time".  Talks about her anger issues and thinks that they are related to her vomiting blood at age 68 from her ulcer.  She is very vague in describing her GI symptoms; has a lot of general complaints.  Current Medications, Allergies, Past Medical History, Past Surgical History, Family History and Social History were reviewed in [employer] record.   Physical Exam: BP 130/80  Pulse 66  Ht 4' 11.25" (1.505 m)  Wt 126 lb (57.153 kg)  BMI 25.23 kg/m2 General: Well developed, white female in no acute distress Head: Normocephalic and atraumatic Eyes:  sclerae anicteric, conjunctiva pink  Ears: Normal auditory acuity Lungs: Clear throughout to auscultation Heart: Regular rate and rhythm Abdomen: Soft, non-tender and non-distended. No masses, no hepatomegaly. Normal bowel sounds. Musculoskeletal: Symmetrical with no gross deformities  Extremities: No edema  Neurological: Alert oriented x 4, grossly nonfocal Psychological:  Alert and cooperative. Normal mood and affect  Assessment and Recommendations: -RUQ pain and atypical chest  pain:  Chest pain sounds musculoskeletal.  I offered her to have a repeat ultrasound of the RUQ, however, she declined at this time.  She is going to restart a once daily PPI and see if it helps any of her symptoms.  I offered her a prescription, but she said that she would just get OTC.  She has follow-up with Dr. Juanda Chance in June.

## 2012-08-28 NOTE — Telephone Encounter (Signed)
Message copied by Jessee Avers on Fri Aug 28, 2012  2:49 PM ------      Message from: Richardson Chiquito      Created: Fri Aug 28, 2012  2:46 PM                   ----- Message -----         From: Princella Pellegrini. Zehr, PA-C         Sent: 08/28/2012   1:40 PM           To: Daphine Deutscher, RN            I saw this patient earlier today and suggested that she restarted PPI once a day until she sees Dr. Juanda Chance next month.  Dr. Juanda Chance reviewed and just recommended that she restart it at twice a day instead for now.  Could you please let her know about Dr. Regino Schultze input?            Thank you,            Jess ------

## 2012-08-28 NOTE — Progress Notes (Signed)
Reviewed.Could she have "gas/bloat" syndrome? Till she sees me in June, please take Prilosec 20 mg bid .May consider Barium study when I see her.

## 2012-08-28 NOTE — Telephone Encounter (Signed)
Informed patient to increase her Prilosec to twice daily. Asked patient if she would like a prescription and she states no she will purchase it over the counter. Also told patient to keep appt with Dr. Juanda Chance and confirmed date and time of appt with patient. She verbalized understanding.

## 2012-08-28 NOTE — Patient Instructions (Addendum)
Start the OTC Prilosec once a day.  Today we have given you samples of this and coupons.  Keep your follow up appointment with Dr. Lina Sar for June 13th at 3:30pm.  Thank you for choosing me and [facility].  Doug Sou, PA-C

## 2012-08-31 ENCOUNTER — Encounter: Payer: Self-pay | Admitting: *Deleted

## 2012-09-25 ENCOUNTER — Ambulatory Visit (INDEPENDENT_AMBULATORY_CARE_PROVIDER_SITE_OTHER): Payer: BC Managed Care – PPO | Admitting: Surgery

## 2012-10-09 ENCOUNTER — Ambulatory Visit: Payer: BC Managed Care – PPO | Admitting: Internal Medicine

## 2012-10-22 ENCOUNTER — Ambulatory Visit (INDEPENDENT_AMBULATORY_CARE_PROVIDER_SITE_OTHER): Payer: BC Managed Care – PPO | Admitting: Surgery

## 2012-10-22 ENCOUNTER — Encounter (INDEPENDENT_AMBULATORY_CARE_PROVIDER_SITE_OTHER): Payer: Self-pay | Admitting: Surgery

## 2012-10-22 VITALS — BP 122/74 | HR 92 | Resp 16 | Ht 59.0 in | Wt 127.2 lb

## 2012-10-22 DIAGNOSIS — Z9889 Other specified postprocedural states: Secondary | ICD-10-CM

## 2012-10-22 DIAGNOSIS — Z09 Encounter for follow-up examination after completed treatment for conditions other than malignant neoplasm: Secondary | ICD-10-CM

## 2012-10-22 NOTE — Progress Notes (Signed)
Jennifer Alvarez 62 y.o.  Body mass index is 25.68 kg/(m^2).  Patient Active Problem List   Diagnosis Date Noted  . Abdominal pain, chronic, right upper quadrant 08/28/2012  . Atypical chest pain 08/28/2012  . Post redo Nissen fundoplication August 2013 01/08/2012  . GERD (gastroesophageal reflux disease) 07/07/2011    Allergies  Allergen Reactions  . Bee Venom Anaphylaxis  . Other Swelling    Tree pollen Congestion, watery eyes, swelling to face /eyes  . Peanut-Containing Drug Products Swelling    Itching around mouth and throat  . Vicodin (Hydrocodone-Acetaminophen) Nausea And Vomiting  . Celebrex (Celecoxib) Palpitations  . Demerol Rash  . Valium Rash    Past Surgical History  Procedure Laterality Date  . Bunionectomy      right foot  . Rhinoplasty    . Pelvic laparoscopy    . Bladder repair    . Nissen fundoplication  2002  . Abdominal hysterectomy    . Colonoscopy    . Laparoscopic nissen fundoplication  12/18/2011    Procedure: LAPAROSCOPIC NISSEN FUNDOPLICATION;  Surgeon: Valarie Merino, MD;  Location: [facility];  Service: General;  Laterality: N/A;  Redo Lap Nissen Fundoplication takedown of herniated nissen fundoplication redo hiatal hernia repair   BURNETT,BRENT A, MD No diagnosis found.  Doing well overall.  Right side pain when lying on her right (intermittent)  may be due to gas trapping in hepatic flexure.  Pain with swallowing liquids is probably from too large a gulp. She seems pleased with her outcome.  Will see prn.  Matt B. Daphine Deutscher, MD, [facility] [phone] beeper [phone]  10/22/2012 11:21 AM

## 2012-10-22 NOTE — Patient Instructions (Signed)
Thanks for your patience.  If you need further assistance after leaving the office, please call our office and speak with a [facility] nurse.  [phone].  If you want to leave a message for Dr. Jemell Town, please call his office phone at [phone]. 

## 2013-01-26 ENCOUNTER — Encounter (INDEPENDENT_AMBULATORY_CARE_PROVIDER_SITE_OTHER): Payer: Self-pay

## 2013-04-20 ENCOUNTER — Telehealth: Payer: Self-pay | Admitting: Certified Nurse Midwife

## 2013-04-20 NOTE — Telephone Encounter (Signed)
Will need chart 

## 2013-04-20 NOTE — Telephone Encounter (Signed)
Pt is requesting a refill on minivelle her prescription has expired please call pt she would like to talk with the person doing the refills. She is using [facility] [place]

## 2013-04-20 NOTE — Telephone Encounter (Signed)
Patient walked in and said she had her MMG on 04/20/13. Patient handed her invoice from [facility] dates 04/20/13. Patient said knew she had to have her MMG done before she could get any refills.

## 2013-04-21 ENCOUNTER — Other Ambulatory Visit: Payer: Self-pay | Admitting: *Deleted

## 2013-04-21 MED ORDER — ESTRADIOL 0.0375 MG/24HR TD PTTW: 1.0000 | MEDICATED_PATCH | TRANSDERMAL | Status: DC

## 2013-04-21 NOTE — Telephone Encounter (Signed)
Per chart her last mm was normal in 12/13, she is due for aex. Ok to refill one month only. Hopefully can call for mammogram when open. And patient needs to schedule aex

## 2013-04-21 NOTE — Telephone Encounter (Signed)
Chart to your desk. Patient brought invoice. Attempted to obtain report from [facility], they are closed today for Holiday.

## 2013-04-21 NOTE — Telephone Encounter (Signed)
Medication refilled for one month. Patient notified and rx sent to pharmacy of choice.

## 2013-05-11 ENCOUNTER — Ambulatory Visit: Payer: BC Managed Care – PPO | Admitting: Certified Nurse Midwife

## 2013-07-15 ENCOUNTER — Encounter: Payer: Self-pay | Admitting: Internal Medicine

## 2014-06-23 ENCOUNTER — Encounter: Payer: Self-pay | Admitting: Internal Medicine

## 2014-07-20 ENCOUNTER — Encounter: Payer: Self-pay | Admitting: Internal Medicine

## 2014-09-21 ENCOUNTER — Encounter: Payer: Self-pay | Admitting: Internal Medicine

## 2014-10-18 ENCOUNTER — Ambulatory Visit (AMBULATORY_SURGERY_CENTER): Payer: Self-pay

## 2014-10-18 VITALS — Ht 59.0 in | Wt 127.0 lb

## 2014-10-18 DIAGNOSIS — K2270 Barrett's esophagus without dysplasia: Secondary | ICD-10-CM

## 2014-10-18 NOTE — Progress Notes (Signed)
No allergies to eggs or soy No diet/weight loss meds No home oxygen No past reaction to anesthesia EXCEPT PONV with general anesthesia  Has email  Emmi instructions given for egd

## 2014-11-01 ENCOUNTER — Encounter: Payer: Self-pay | Admitting: Internal Medicine

## 2014-11-01 ENCOUNTER — Ambulatory Visit (AMBULATORY_SURGERY_CENTER): Payer: BLUE CROSS/BLUE SHIELD | Admitting: Internal Medicine

## 2014-11-01 VITALS — BP 117/65 | HR 63 | Temp 97.4°F | Resp 14 | Wt 127.0 lb

## 2014-11-01 DIAGNOSIS — K2270 Barrett's esophagus without dysplasia: Secondary | ICD-10-CM

## 2014-11-01 DIAGNOSIS — K228 Other specified diseases of esophagus: Secondary | ICD-10-CM | POA: Diagnosis not present

## 2014-11-01 MED ORDER — SODIUM CHLORIDE 0.9 % IV SOLN: 500.0000 mL | INTRAVENOUS | Status: DC

## 2014-11-01 MED ORDER — OMEPRAZOLE 40 MG PO CPDR: 40.0000 mg | DELAYED_RELEASE_CAPSULE | Freq: Two times a day (BID) | ORAL | Status: DC

## 2014-11-01 NOTE — Op Note (Addendum)
[place]  [address]. [place], [id number]   ENDOSCOPY PROCEDURE REPORT  PATIENT: Jennifer, Alvarez  MR#: [id number] BIRTHDATE: 01/06/1951 , 89  yrs. old GENDER: female ENDOSCOPIST: Lafayette Dragon, MD REFERRED BY:  Juanita Craver, M.D. PROCEDURE DATE:  11/01/2014 PROCEDURE:  EGD w/ biopsy ASA CLASS:     Class II INDICATIONS:  History of Barrett's esophagus status post Nissen fundoplication -2.  In 2002 and in 2013.  Continued reflux refractory to PPI. Prilosec 20 mg bid) MEDICATIONS: Monitored anesthesia care and Propofol 120 mg IV TOPICAL ANESTHETIC: none  DESCRIPTION OF PROCEDURE: After the risks benefits and alternatives of the procedure were thoroughly explained, informed consent was obtained.  The LB [id number] endoscope was introduced through the mouth and advanced to the second portion of the duodenum , Without limitations.  The instrument was slowly withdrawn as the mucosa was fully examined.      ESOPHAGUS: There was LA Class B esophagitis (One or more mucosal breaks > 70mm, but without continuity across mucosal folds) noted. Three 76mm erosions were found in the distal esophagus.  A biopsy was performed using cold forceps.  Sample sent for histology. Stomach: there was a 4 cm reducible hiatal hernia and AL functioning Nissan fundoplication confirmed by retroflexing the endoscope in the gastric antrum. Mild nonobstructing esophageal stricture at 35 cm.  Retroflexed views revealed no abnormalities. Duodenum: duodenal bulb and descending duodenum was normal   The scope was then withdrawn from the patient and the procedure completed.  COMPLICATIONS: There were no immediate complications.  ENDOSCOPIC IMPRESSION: 1.   There was LA Class B esophagitis noted ,status post biopsies to rule out Barrett's esophagus 2.   Three 11mm erosions were found in the distal esophagus; biopsy was performed 3. presence of prior Nissen fundoplication 4. 3-5 cm  partially reducible hiatal hernia  RECOMMENDATIONS:  1.  Await pathology results 2.  Anti-reflux regimen to be follow 3.  Continue PPI  , increase Prilosec to 40 mg po bid, take Gaviscon 1-2 tabs pc and prn heartburn 4  .OV 6-8 weeks  REPEAT EXAM: for EGD pending biopsy results.  eSigned:  Lafayette Dragon, MD 11/01/2014 11:43 AM Revised: 11/01/2014 11:43 AM   CC:  PATIENT NAME:  Jennifer, Alvarez MR#: [id number]

## 2014-11-01 NOTE — Patient Instructions (Signed)
YOU HAD AN ENDOSCOPIC PROCEDURE TODAY AT [facility]:   Refer to the procedure report that was given to you for any specific questions about what was found during the examination.  If the procedure report does not answer your questions, please call your gastroenterologist to clarify.  If you requested that your care partner not be given the details of your procedure findings, then the procedure report has been included in a sealed envelope for you to review at your convenience later.  YOU SHOULD EXPECT: Some feelings of bloating in the abdomen. Passage of more gas than usual.  Walking can help get rid of the air that was put into your GI tract during the procedure and reduce the bloating. If you had a lower endoscopy (such as a colonoscopy or flexible sigmoidoscopy) you may notice spotting of blood in your stool or on the toilet paper. If you underwent a bowel prep for your procedure, you may not have a normal bowel movement for a few days.  Please Note:  You might notice some irritation and congestion in your nose or some drainage.  This is from the oxygen used during your procedure.  There is no need for concern and it should clear up in a day or so.  SYMPTOMS TO REPORT IMMEDIATELY:   Following upper endoscopy (EGD)  Vomiting of blood or coffee ground material  New chest pain or pain under the shoulder blades  Painful or persistently difficult swallowing  New shortness of breath  Fever of 100F or higher  Black, tarry-looking stools  For urgent or emergent issues, a gastroenterologist can be reached at any hour by calling [phone].   DIET: Your first meal following the procedure should be a small meal and then it is ok to progress to your normal diet. Heavy or fried foods are harder to digest and may make you feel nauseous or bloated.  Likewise, meals heavy in dairy and vegetables can increase bloating.  Drink plenty of fluids but you should avoid alcoholic beverages for  24 hours.  ACTIVITY:  You should plan to take it easy for the rest of today and you should NOT DRIVE or use heavy machinery until tomorrow (because of the sedation medicines used during the test).    FOLLOW UP: Our staff will call the number listed on your records the next business day following your procedure to check on you and address any questions or concerns that you may have regarding the information given to you following your procedure. If we do not reach you, we will leave a message.  However, if you are feeling well and you are not experiencing any problems, there is no need to return our call.  We will assume that you have returned to your regular daily activities without incident.  If any biopsies were taken you will be contacted by phone or by letter within the next 1-3 weeks.  Please call us at [phone] if you have not heard about the biopsies in 3 weeks.    SIGNATURES/CONFIDENTIALITY: You and/or your care partner have signed paperwork which will be entered into your electronic medical record.  These signatures attest to the fact that that the information above on your After Visit Summary has been reviewed and is understood.  Full responsibility of the confidentiality of this discharge information lies with you and/or your care-partner.  Esophagitis information given.  Hiatal hernia and anti-reflux regimen information given.  See Dr. Olevia Perches in 6-8 weeks-office visit.  Await pathology results.

## 2014-11-01 NOTE — Progress Notes (Signed)
A/ox3 pleased with MAC, report to Jane RN 

## 2014-11-01 NOTE — Progress Notes (Signed)
Called to room to assist during endoscopic procedure.  Patient ID and intended procedure confirmed with present staff. Received instructions for my participation in the procedure from the performing physician.  

## 2014-11-02 ENCOUNTER — Telehealth: Payer: Self-pay | Admitting: *Deleted

## 2014-11-02 NOTE — Telephone Encounter (Signed)
No answer, message left for the patient. 

## 2014-11-03 ENCOUNTER — Telehealth: Payer: Self-pay | Admitting: Internal Medicine

## 2014-11-03 NOTE — Telephone Encounter (Signed)
Spoke with patient and she had EGD Tuesday. She states she has had a scratchy, sore throat since the procedure. She took Advil and it helped. She states she was hoarse yesterday. Suggested she take Tylenol instead and use warm salt water rinses.

## 2014-11-03 NOTE — Telephone Encounter (Signed)
I totally agree with your recommendations. She was coughing during the procedure and after as well.

## 2014-11-08 ENCOUNTER — Encounter: Payer: Self-pay | Admitting: Internal Medicine

## 2014-12-21 ENCOUNTER — Encounter: Payer: Self-pay | Admitting: Internal Medicine

## 2014-12-21 ENCOUNTER — Ambulatory Visit (INDEPENDENT_AMBULATORY_CARE_PROVIDER_SITE_OTHER): Payer: BLUE CROSS/BLUE SHIELD | Admitting: Internal Medicine

## 2014-12-21 VITALS — BP 130/70 | HR 76 | Ht 59.0 in | Wt 127.8 lb

## 2014-12-21 DIAGNOSIS — K2100 Gastro-esophageal reflux disease with esophagitis, without bleeding: Secondary | ICD-10-CM

## 2014-12-21 DIAGNOSIS — K219 Gastro-esophageal reflux disease without esophagitis: Secondary | ICD-10-CM

## 2014-12-21 NOTE — Progress Notes (Signed)
[facility] [birth year] [id number]  Note: This dictation was prepared with Dragon digital system. Any transcriptional errors that result from this procedure are unintentional.   History of Present Illness: This is a 64 year old white female with history of gastroesophageal reflux Barrett's esophagus. Status post Nissen fundoplication in 3532 and again in 2013. Last endoscopy in July 2016 showedl class B esophagitis with biopsies revealing no evidence of Barrett's esophagus. She had a 4 cm reducible hiatal hernia. Functioning Nissen fundoplication and nonobstructing esophageal ring. She is under good control with omeprazole 40 mg twice a day but is reluctant to continue taking PPI's because of concern for long-term side effects. She has decreased Prilosec to 40 mg daily and is doing well except for occasional breakthrough symptoms. His been on dietary modifications and an antireflux measures. She does not drink any alcohol. His having a lot of stress with her 43 year old mother. Her father had esophageal cancer in one of her daughters already had Nissen fundoplication    Past Medical History  Diagnosis Date  . Gastroparesis 08/19/00  . GERD (gastroesophageal reflux disease)   . Fuchs' corneal dystrophy   . Allergy   . 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 Complication of anesthesia     states chipped teeth with intubation  . PONV (postoperative nausea and vomiting)   . Seasonal allergies   . Migraine     migraines in past  . Hiatal hernia     Past Surgical History  Procedure Laterality Date  . Bunionectomy      right foot  . Rhinoplasty    . Pelvic laparoscopy    . Bladder repair    . Nissen fundoplication  9924  . Abdominal hysterectomy    . Colonoscopy    . Laparoscopic nissen fundoplication  2/68/3419    Procedure: LAPAROSCOPIC NISSEN FUNDOPLICATION;  Surgeon: Pedro Earls, MD;  Location: [facility];  Service: General;  Laterality: N/A;  Redo Lap Nissen Fundoplication  takedown of herniated nissen fundoplication redo hiatal hernia repair    Allergies  Allergen Reactions  . Bee Venom Anaphylaxis  . Other Swelling    Tree pollen Congestion, watery eyes, swelling to face /eyes  . Peanut-Containing Drug Products Swelling    Itching around mouth and throat  . Vicodin [Hydrocodone-Acetaminophen] Nausea And Vomiting  . Celebrex [Celecoxib] Palpitations  . Demerol Rash  . Valium Rash    Family history and social history have been reviewed.  Review of Systems: Occasional heartburn. Denies dysphagia. Denies nocturnal cough or hoarseness The remainder of the 10 point ROS is negative except as outlined in the H&P  Physical Exam: General Appearance Well developed, in no distress Psychological Normal mood and affect  Assessment and Plan:   64 year old white female with gastroesophageal reflux reasonably well controlled on omeprazole 40 mg daily. We have discussed all antireflux measures again. She may substitutel H2 receptor antagonist for PPI. I have asked her to purchase Gaviscon and chew one or 2 tablets every few hours prn symptomatic relief. To schedule barium esophagram to assess motility as well as reflux.    Delfin Edis 12/21/2014

## 2014-12-21 NOTE — Patient Instructions (Addendum)
You have been scheduled for a Barium Esophogram at [facility] (1st floor of the hospital) on 12/29/2014 at 10:00am. Please arrive 15 minutes prior to your appointment for registration. Make certain not to have anything to eat or drink 3 hours prior to your test. If you need to reschedule for any reason, please contact radiology at [phone] to do so. __________________________________________________________________ A barium swallow is an examination that concentrates on views of the esophagus. This tends to be a double contrast exam (barium and two liquids which, when combined, create a gas to distend the wall of the oesophagus) or single contrast (non-ionic iodine based). The study is usually tailored to your symptoms so a good history is essential. Attention is paid during the study to the form, structure and configuration of the esophagus, looking for functional disorders (such as aspiration, dysphagia, achalasia, motility and reflux) EXAMINATION You may be asked to change into a gown, depending on the type of swallow being performed. A radiologist and radiographer will perform the procedure. The radiologist will advise you of the type of contrast selected for your procedure and direct you during the exam. You will be asked to stand, sit or lie in several different positions and to hold a small amount of fluid in your mouth before being asked to swallow while the imaging is performed .In some instances you may be asked to swallow barium coated marshmallows to assess the motility of a solid food bolus. The exam can be recorded as a digital or video fluoroscopy procedure. POST PROCEDURE It will take 1-2 days for the barium to pass through your system. To facilitate this, it is important, unless otherwise directed, to increase your fluids for the next 24-48hrs and to resume your normal diet.  This test typically takes about 30 minutes to  perform. __________________________________________________________________________________   Dennis Bast can take Gaviscon, 1-2 every 2 hours as needed for heartburn  Dr Tollie Pizza

## 2014-12-29 ENCOUNTER — Ambulatory Visit (HOSPITAL_COMMUNITY)
Admission: RE | Admit: 2014-12-29 | Discharge: 2014-12-29 | Disposition: A | Discharge status: Home / Self Care | Payer: BLUE CROSS/BLUE SHIELD | Source: Ambulatory Visit | Attending: Internal Medicine | Admitting: Internal Medicine

## 2014-12-29 DIAGNOSIS — K219 Gastro-esophageal reflux disease without esophagitis: Secondary | ICD-10-CM

## 2014-12-29 DIAGNOSIS — K224 Dyskinesia of esophagus: Secondary | ICD-10-CM | POA: Diagnosis not present

## 2015-02-20 ENCOUNTER — Ambulatory Visit (INDEPENDENT_AMBULATORY_CARE_PROVIDER_SITE_OTHER): Payer: BLUE CROSS/BLUE SHIELD | Admitting: Gastroenterology

## 2015-02-20 ENCOUNTER — Ambulatory Visit (INDEPENDENT_AMBULATORY_CARE_PROVIDER_SITE_OTHER)
Admission: RE | Admit: 2015-02-20 | Discharge: 2015-02-20 | Disposition: A | Discharge status: Home / Self Care | Payer: BLUE CROSS/BLUE SHIELD | Source: Ambulatory Visit | Attending: Gastroenterology | Admitting: Gastroenterology

## 2015-02-20 ENCOUNTER — Encounter: Payer: Self-pay | Admitting: Gastroenterology

## 2015-02-20 VITALS — BP 136/72 | HR 72 | Ht 59.0 in | Wt 129.0 lb

## 2015-02-20 DIAGNOSIS — M810 Age-related osteoporosis without current pathological fracture: Secondary | ICD-10-CM | POA: Diagnosis not present

## 2015-02-20 DIAGNOSIS — K219 Gastro-esophageal reflux disease without esophagitis: Secondary | ICD-10-CM | POA: Diagnosis not present

## 2015-02-20 NOTE — Patient Instructions (Signed)
Please go to the basement for a Dexa Scan in the radiology department.   Your physician has requested that you go to the basement for lab work before leaving today.

## 2015-02-20 NOTE — Progress Notes (Signed)
HPI :  64 y/o female, former patient of Dr. Olevia Perches, new to me, here for follow up for history of reflux and reported gastroparesis.   Her main issue at this time is GERD. She is taking omeprazole 40mg  BID and gaviscon PRN for reflux. Her main symptoms of GERD are fullness / burning in her substernal area. She has had prior cardiac evaluatoin for her symptoms which was negative. Her first Nissen was in 2002 or so. She reports she felt well for 4 years without symptoms at all and did quite well initially. She reports in 2006 time frame her symptoms recurred and bothered her significantly. She eventually had a repeat Nissen again in 2013, but states it did not help as much as it did the first time. She reported development of dysphagia after her second surgery which can occur periodically. She has to be careful to not eat and drink at the same time.   On her present regimen as above she will not have much symptoms if she takes her medication. She tolerates her medications well. She will not have significant breakthrough if she takes it, but if she misses a dose she is uncomfortable. Gaviscon helps to take PRN.   She has had a prior DEXA scan screening for osteoporosis which was negative, she thinks it was more than 5 years ago. She denies significant postprandial fullness or early satiety. Prior gastric emptying study was positive for gastroparesis in 2002. She denies history of reglan use or domperidone. She otherwise had a barium swallow since her last visit showing nonspecific dysmotility of the esophagus.   Last EGD done in July of this year showed 4cm hiatal hernia with LA grade B esophagitis.  Colonoscopy 2007 - no polyps     Past Medical History  Diagnosis Date  . Gastroparesis 08/19/00  . GERD (gastroesophageal reflux disease)   . Fuchs' corneal dystrophy   . Allergy   . 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 Complication of anesthesia     states chipped teeth with intubation  .  PONV (postoperative nausea and vomiting)   . Seasonal allergies   . Migraine     migraines in past  . Hiatal hernia      Past Surgical History  Procedure Laterality Date  . Bunionectomy      right foot  . Rhinoplasty    . Pelvic laparoscopy    . Bladder repair    . Nissen fundoplication  1761  . Abdominal hysterectomy    . Colonoscopy    . Laparoscopic nissen fundoplication  10/03/3708    Procedure: LAPAROSCOPIC NISSEN FUNDOPLICATION;  Surgeon: Pedro Earls, MD;  Location: [facility];  Service: General;  Laterality: N/A;  Redo Lap Nissen Fundoplication takedown of herniated nissen fundoplication redo hiatal hernia repair   Family History  Problem Relation Age of Onset  . Colon cancer Neg Hx   . Heart disease Father   . Esophageal cancer Father 79    died age 25  . Hypertension Mother     age 32  . Anxiety disorder Mother   . Depression Mother    Social History  Substance Use Topics  . Smoking status: Never Smoker   . Smokeless tobacco: Never Used  . Alcohol Use: No   Current Outpatient Prescriptions  Medication Sig Dispense Refill  . aspirin EC 81 MG tablet Take 81 mg by mouth daily.    . Calcium-Vitamin D-Vitamin K 626-948-54 MG-UNT-MCG CHEW Chew  2-3 tablets by mouth daily.    . cetirizine (ZYRTEC) 10 MG tablet Take 10 mg by mouth daily.    Marland Kitchen estradiol (MINIVELLE) 0.0375 MG/24HR Place 1 patch onto the skin 2 (two) times a week. 8 patch 0  . omeprazole (PRILOSEC) 40 MG capsule Take 1 capsule (40 mg total) by mouth 2 (two) times daily. 180 capsule 6  . PRESCRIPTION MEDICATION Tree serum injection administered by nurse at the patient's office     No current facility-administered medications for this visit.   Allergies  Allergen Reactions  . Bee Venom Anaphylaxis  . Peanuts [Peanut Oil] Anaphylaxis  . Other Swelling    Tree pollen Congestion, watery eyes, swelling to face /eyes  . Peanut-Containing Drug Products Swelling    Itching around mouth and throat  .  Vicodin [Hydrocodone-Acetaminophen] Nausea And Vomiting  . Celebrex [Celecoxib] Palpitations  . Demerol Rash  . Valium Rash     Review of Systems: All systems reviewed and negative except where noted in HPI.   No recent labs on file - all done per PCM at outside lab  Recent imaging and endoscopy reviewed in HPI.   Physical Exam: BP 136/72 mmHg  Pulse 72  Ht 4\' 11"  (1.499 m)  Wt 129 lb (58.514 kg)  BMI 26.04 kg/m2 Constitutional: Pleasant,well-developed, female in no acute distress. HEENT: Normocephalic and atraumatic. Conjunctivae are normal. No scleral icterus. Neck supple.  Cardiovascular: Normal rate, regular rhythm.  Pulmonary/chest: Effort normal and breath sounds normal. No wheezing, rales or rhonchi. Abdominal: Soft, nondistended, nontender. Bowel sounds active throughout. There are no masses palpable. No hepatomegaly. Extremities: no edema Lymphadenopathy: No cervical adenopathy noted. Neurological: Alert and oriented to person place and time. Skin: Skin is warm and dry. No rashes noted. Psychiatric: Normal mood and affect. Behavior is normal.   ASSESSMENT AND PLAN: 64 y/o female with longstanding reflux. History as above. She is s/p 2 Nissens, with some relief after each surgery but benefit not long lasting. She has a recurrence of hiatal hernia with esophagitis on last EGD. She also appears to have a history of gastroparesis noted back in 2002, and some evidence of esophageal dysmotility on most recent barium study.   I discussed reflux with her in general. While she has a recurrence of hiatal hernia she doesn't wish for any aggressive interventions or repeat surgeries after her experience with the first two. As long as she takes her PPI she does not have symptoms and feels relatively well. I counseled her on the long term risks of PPI use. The risks of long term PPIs with current data include increased risk for chronic kidney disease, increased risk of fracture,  increased risk of C diff, increased risk of pneumonia (short term usage), potentially increased risk of dementia, potentially increased risk of B12 / calcium deficiency, and rare risk of hypomagnesemia. Patient was counseled to use the lowest daily use of PPI needed to control symptoms. I have requested most recent labs from her [facility] and would recommend checking renal function yearly while on PPI therapy. Otherwise recommend a baseline DEXA exam if she needs to continue PPI at this dose to rule out osteopenia / osteoporosis.   Otherwise, while she does have a history of gastroparesis I discussed treatment for this which could improve her reflux symptoms as well. After a discussion of Reglan and Domperidone, risks / benefits of each, she did not want to try either at present time given she feels well on PPI. We also discussed likely  esophageal dysmotility and offered her manometry to confirm this as possible etiology for her dysphagia, however she was not interested in this procedure either.   We will see how she does over the next few months. I will let her know DEXA results and await results of labs. If she has a change if heart about a trial of reglan she will let me know.     Cellar, MD [facility] Pager [phone]

## 2015-02-21 ENCOUNTER — Other Ambulatory Visit: Payer: BLUE CROSS/BLUE SHIELD

## 2015-02-21 DIAGNOSIS — K219 Gastro-esophageal reflux disease without esophagitis: Secondary | ICD-10-CM

## 2015-02-21 DIAGNOSIS — M810 Age-related osteoporosis without current pathological fracture: Secondary | ICD-10-CM

## 2015-02-23 ENCOUNTER — Telehealth: Payer: Self-pay | Admitting: Gastroenterology

## 2015-02-23 NOTE — Telephone Encounter (Signed)
Patient had labs sent from [facility]. Cr is 0.8 with GFR > 60 on most recent labs, done 06/03/14. She should have this drawn once yearly while on PPI. Records scanned into Epic

## 2015-08-09 ENCOUNTER — Other Ambulatory Visit: Payer: Self-pay | Admitting: Radiology

## 2016-03-05 ENCOUNTER — Encounter: Payer: Self-pay | Admitting: Gastroenterology

## 2016-04-16 ENCOUNTER — Ambulatory Visit (AMBULATORY_SURGERY_CENTER): Payer: Self-pay | Admitting: *Deleted

## 2016-04-16 VITALS — Ht 59.0 in | Wt 128.0 lb

## 2016-04-16 DIAGNOSIS — Z1211 Encounter for screening for malignant neoplasm of colon: Secondary | ICD-10-CM

## 2016-04-16 MED ORDER — NA SULFATE-K SULFATE-MG SULF 17.5-3.13-1.6 GM/177ML PO SOLN: 1.0000 | Freq: Once | ORAL | 0 refills | Status: AC

## 2016-04-16 NOTE — Progress Notes (Signed)
No egg or soy allergy. Post Nausea and vomiting.  No home O2.  No diet meds.

## 2016-04-30 ENCOUNTER — Encounter: Payer: BLUE CROSS/BLUE SHIELD | Admitting: Gastroenterology

## 2016-06-17 ENCOUNTER — Ambulatory Visit (AMBULATORY_SURGERY_CENTER): Payer: BLUE CROSS/BLUE SHIELD | Admitting: Gastroenterology

## 2016-06-17 ENCOUNTER — Encounter: Payer: Self-pay | Admitting: Gastroenterology

## 2016-06-17 ENCOUNTER — Other Ambulatory Visit (INDEPENDENT_AMBULATORY_CARE_PROVIDER_SITE_OTHER): Payer: BLUE CROSS/BLUE SHIELD

## 2016-06-17 ENCOUNTER — Other Ambulatory Visit: Payer: Self-pay | Admitting: Gastroenterology

## 2016-06-17 VITALS — BP 100/70 | HR 66 | Temp 98.6°F | Resp 15 | Ht 59.0 in | Wt 128.0 lb

## 2016-06-17 DIAGNOSIS — D120 Benign neoplasm of cecum: Secondary | ICD-10-CM | POA: Diagnosis not present

## 2016-06-17 DIAGNOSIS — Z1212 Encounter for screening for malignant neoplasm of rectum: Secondary | ICD-10-CM | POA: Diagnosis not present

## 2016-06-17 DIAGNOSIS — D126 Benign neoplasm of colon, unspecified: Secondary | ICD-10-CM | POA: Diagnosis not present

## 2016-06-17 DIAGNOSIS — K635 Polyp of colon: Secondary | ICD-10-CM | POA: Diagnosis not present

## 2016-06-17 DIAGNOSIS — D127 Benign neoplasm of rectosigmoid junction: Secondary | ICD-10-CM | POA: Diagnosis not present

## 2016-06-17 DIAGNOSIS — Z1211 Encounter for screening for malignant neoplasm of colon: Secondary | ICD-10-CM | POA: Diagnosis present

## 2016-06-17 DIAGNOSIS — R1031 Right lower quadrant pain: Secondary | ICD-10-CM | POA: Diagnosis not present

## 2016-06-17 LAB — BUN: BUN: 9 mg/dL (ref 6–23)

## 2016-06-17 LAB — CREATININE, SERUM: CREATININE: 0.82 mg/dL (ref 0.40–1.20)

## 2016-06-17 MED ORDER — SODIUM CHLORIDE 0.9 % IV SOLN: 500.0000 mL | INTRAVENOUS | Status: DC

## 2016-06-17 NOTE — Progress Notes (Signed)
Pt's states no medical or surgical changes since previsit or office visit. 

## 2016-06-17 NOTE — Patient Instructions (Signed)
YOU HAD AN ENDOSCOPIC PROCEDURE TODAY AT [facility]:   Refer to the procedure report that was given to you for any specific questions about what was found during the examination.  If the procedure report does not answer your questions, please call your gastroenterologist to clarify.  If you requested that your care partner not be given the details of your procedure findings, then the procedure report has been included in a sealed envelope for you to review at your convenience later.  YOU SHOULD EXPECT: Some feelings of bloating in the abdomen. Passage of more gas than usual.  Walking can help get rid of the air that was put into your GI tract during the procedure and reduce the bloating. If you had a lower endoscopy (such as a colonoscopy or flexible sigmoidoscopy) you may notice spotting of blood in your stool or on the toilet paper. If you underwent a bowel prep for your procedure, you may not have a normal bowel movement for a few days.  Please Note:  You might notice some irritation and congestion in your nose or some drainage.  This is from the oxygen used during your procedure.  There is no need for concern and it should clear up in a day or so.  SYMPTOMS TO REPORT IMMEDIATELY:   Following lower endoscopy (colonoscopy or flexible sigmoidoscopy):  Excessive amounts of blood in the stool  Significant tenderness or worsening of abdominal pains  Swelling of the abdomen that is new, acute  Fever of 100F or higher  For urgent or emergent issues, a gastroenterologist can be reached at any hour by calling [phone].   DIET:  We do recommend a small meal at first, but then you may proceed to your regular diet.  Drink plenty of fluids but you should avoid alcoholic beverages for 24 hours. Try to increase the fiber in your diet, and drink plenty of water.  ACTIVITY:  You should plan to take it easy for the rest of today and you should NOT DRIVE or use heavy machinery until  tomorrow (because of the sedation medicines used during the test).    FOLLOW UP: Our staff will call the number listed on your records the next business day following your procedure to check on you and address any questions or concerns that you may have regarding the information given to you following your procedure. If we do not reach you, we will leave a message.  However, if you are feeling well and you are not experiencing any problems, there is no need to return our call.  We will assume that you have returned to your regular daily activities without incident.  If any biopsies were taken you will be contacted by phone or by letter within the next 1-3 weeks.  Please call us at [phone] if you have not heard about the biopsies in 3 weeks.    SIGNATURES/CONFIDENTIALITY: You and/or your care partner have signed paperwork which will be entered into your electronic medical record.  These signatures attest to the fact that that the information above on your After Visit Summary has been reviewed and is understood.  Full responsibility of the confidentiality of this discharge information lies with you and/or your care-partner.  Read all of the handouts given to you by your recovery room nurse.  No NSAIDS for two weeks per Dr. Havery Moros.

## 2016-06-17 NOTE — Progress Notes (Signed)
Called to room to assist during endoscopic procedure.  Patient ID and intended procedure confirmed with present staff. Received instructions for my participation in the procedure from the performing physician.  

## 2016-06-17 NOTE — Op Note (Signed)
[place] Patient Name: Jennifer Alvarez Procedure Date: 06/17/2016 3:09 PM MRN: [id number] Endoscopist: Remo Lipps P. Armbruster MD, MD Age: 66 Referring MD:  Date of Birth: [birth year] Gender: Female Account #: [id number] Procedure:                Colonoscopy Indications:              Screening for colorectal malignant neoplasm,                            patient incidentally endorses intermittent RLQ pain Medicines:                Monitored Anesthesia Care Procedure:                Pre-Anesthesia Assessment:                           - Prior to the procedure, a History and Physical                            was performed, and patient medications and                            allergies were reviewed. The patient's tolerance of                            previous anesthesia was also reviewed. The risks                            and benefits of the procedure and the sedation                            options and risks were discussed with the patient.                            All questions were answered, and informed consent                            was obtained. Prior Anticoagulants: The patient has                            taken no previous anticoagulant or antiplatelet                            agents. ASA Grade Assessment: II - A patient with                            mild systemic disease. After reviewing the risks                            and benefits, the patient was deemed in                            satisfactory condition to undergo the procedure.  After obtaining informed consent, the colonoscope                            was passed under direct vision. Throughout the                            procedure, the patient's blood pressure, pulse, and                            oxygen saturations were monitored continuously. The                            Model PCF-H190L [id number]) scope was introduced                            through  the anus and advanced to the the terminal                            ileum, with identification of the appendiceal                            orifice and IC valve. The colonoscopy was performed                            without difficulty. The patient tolerated the                            procedure well. The quality of the bowel                            preparation was good. The terminal ileum, ileocecal                            valve, appendiceal orifice, and rectum were                            photographed. Scope In: 3:27:00 PM Scope Out: 3:48:24 PM Scope Withdrawal Time: 0 hours 17 minutes 39 seconds  Total Procedure Duration: 0 hours 21 minutes 24 seconds  Findings:                 The perianal and digital rectal examinations were                            normal.                           Multiple medium-mouthed diverticula were found in                            the left colon.                           A 4 mm polyp was found in the cecum. The polyp was  sessile. The polyp was removed with a cold snare.                            Resection and retrieval were complete.                           A 4 mm polyp was found in the recto-sigmoid colon.                            The polyp was sessile. The polyp was removed with a                            cold snare. Resection and retrieval were complete.                           The terminal ileum appeared normal.                           Internal hemorrhoids were found during                            retroflexion. The hemorrhoids were small.                           The exam was otherwise without abnormality. Complications:            No immediate complications. Estimated blood loss:                            Minimal. Estimated Blood Loss:     Estimated blood loss was minimal. Impression:               - Diverticulosis in the left colon.                           - One 4 mm polyp in the  cecum, removed with a cold                            snare. Resected and retrieved.                           - One 4 mm polyp at the recto-sigmoid colon,                            removed with a cold snare. Resected and retrieved.                           - The examined portion of the ileum was normal.                           - Internal hemorrhoids.                           - The examination was otherwise normal. Recommendation:           -  Patient has a contact number available for                            emergencies. The signs and symptoms of potential                            delayed complications were discussed with the                            patient. Return to normal activities tomorrow.                            Written discharge instructions were provided to the                            patient.                           - Resume previous diet.                           - Continue present medications.                           - No aspirin, ibuprofen, naproxen, or other                            non-steroidal anti-inflammatory drugs for 2 weeks                            after polyp removal.                           - Await pathology results.                           - Repeat colonoscopy is recommended for                            surveillance. The colonoscopy date will be                            determined after pathology results from today's                            exam become available for review.                           - If RLQ pain persists, recommend further                            evaluation with CT scan abdomen / pelvis with                            contrast Remo Lipps P. Armbruster MD, MD 06/17/2016 3:52:22 PM This report has been signed electronically.

## 2016-06-18 ENCOUNTER — Telehealth: Payer: Self-pay

## 2016-06-18 ENCOUNTER — Other Ambulatory Visit: Payer: Self-pay

## 2016-06-18 ENCOUNTER — Telehealth: Payer: Self-pay | Admitting: Gastroenterology

## 2016-06-18 DIAGNOSIS — R109 Unspecified abdominal pain: Secondary | ICD-10-CM

## 2016-06-18 NOTE — Telephone Encounter (Signed)
Routed to [place], as she is working on this.

## 2016-06-18 NOTE — Telephone Encounter (Signed)
See prior note. Pt scheduled.

## 2016-06-18 NOTE — Telephone Encounter (Signed)
-----   Message from Manus Gunning, MD sent at 06/17/2016  4:51 PM EST ----- Fran Lowes, I performed an endoscopy on this patient today, she reports having abdominal pain for the past year and I didn't see any cause.   Can you help coordinate a CT abdomen / pelvis with contrast for her? Maybe you can call her tomorrow, she is recovering from anesthesia this afternoon. Thanks much  Dr. Loni Muse

## 2016-06-18 NOTE — Telephone Encounter (Signed)
  Follow up Call-  Call back number 06/17/2016 11/01/2014  Post procedure Call Back phone  # [phone] cell  [phone]  Permission to leave phone message Yes Yes  Some recent data might be hidden     Patient questions:  Do you have a fever, pain , or abdominal swelling? No. Pain Score  0 *  Have you tolerated food without any problems? Yes.    Have you been able to return to your normal activities? Yes.    Do you have any questions about your discharge instructions: Diet   No. Medications  No. Follow up visit  No.  Do you have questions or concerns about your Care? No.  Actions: * If pain score is 4 or above: No action needed, pain <4.  Patient states at IV site a little red in spots around site. No swelling or drainage. Instructed to continue monitoring site and to call if worsens.

## 2016-06-18 NOTE — Telephone Encounter (Signed)
Pt scheduled for CT abd/Pel w/wo contrast 06/21/16 at 1pm at [facility]. She will arrive at 1:45pm and will be NPO 4h prior and drink contrasst at 11am and 12pm. Pt understood.

## 2016-06-19 ENCOUNTER — Other Ambulatory Visit: Payer: Self-pay

## 2016-06-19 DIAGNOSIS — R109 Unspecified abdominal pain: Secondary | ICD-10-CM

## 2016-06-19 NOTE — Telephone Encounter (Signed)
Per Dr Havery Moros Ct should be WITH contrast. Order changed and scan will now be at [facility] on [facility]. On 06/26/16 at 1:30. Drink contrast at 11:30 and 12:30. NPO solids 4h prior. She may have liqiuds. Called to inform pt and spoke with husband (pt is at work) He states that pt cancelled original CT date and that he will speak with her and have her call me back.

## 2016-06-21 ENCOUNTER — Encounter: Payer: Self-pay | Admitting: Gastroenterology

## 2016-06-21 ENCOUNTER — Ambulatory Visit (HOSPITAL_COMMUNITY): Payer: BLUE CROSS/BLUE SHIELD

## 2016-06-24 ENCOUNTER — Telehealth: Payer: Self-pay

## 2016-06-24 NOTE — Telephone Encounter (Signed)
Okay thanks for letting me know

## 2016-06-24 NOTE — Telephone Encounter (Signed)
Stacey from [facility] called to let us know that patient had been called from the hospital to let her know that she owed large copay prior to CT. Patient could not pay for this and cancelled her scan for the hospital. Erline Levine let her know that it is cheaper at [place], gave her the number to call her and reschedule. She also let her know that her insurance should be first.

## 2016-06-26 ENCOUNTER — Inpatient Hospital Stay: Admission: RE | Admit: 2016-06-26 | Payer: BLUE CROSS/BLUE SHIELD | Source: Ambulatory Visit

## 2016-07-18 ENCOUNTER — Telehealth: Payer: Self-pay | Admitting: Gastroenterology

## 2016-07-18 ENCOUNTER — Other Ambulatory Visit: Payer: Self-pay

## 2016-07-18 NOTE — Telephone Encounter (Signed)
Called scheduling to see if order was still in Epic and if patient can call and reschedule herself. They did see it and said to have patient call them to reschedule. Patient notified, did verbally go over instructions for contrast. Patient states she still has the contrast at home, understands to contact our office if she has further questions or concerns.

## 2016-07-29 ENCOUNTER — Ambulatory Visit (INDEPENDENT_AMBULATORY_CARE_PROVIDER_SITE_OTHER)
Admission: RE | Admit: 2016-07-29 | Discharge: 2016-07-29 | Disposition: A | Discharge status: Home / Self Care | Payer: BLUE CROSS/BLUE SHIELD | Source: Ambulatory Visit | Attending: Gastroenterology | Admitting: Gastroenterology

## 2016-07-29 DIAGNOSIS — R109 Unspecified abdominal pain: Secondary | ICD-10-CM | POA: Diagnosis not present

## 2016-07-29 MED ORDER — IOPAMIDOL (ISOVUE-300) INJECTION 61%
100.0000 mL | Freq: Once | INTRAVENOUS | Status: AC | PRN
  Administered 2016-07-29: 100 mL via INTRAVENOUS

## 2017-08-22 ENCOUNTER — Ambulatory Visit
Admission: RE | Admit: 2017-08-22 | Discharge: 2017-08-22 | Disposition: A | Discharge status: Home / Self Care | Payer: BLUE CROSS/BLUE SHIELD | Source: Ambulatory Visit | Attending: Physician Assistant | Admitting: Physician Assistant

## 2017-08-22 ENCOUNTER — Other Ambulatory Visit: Payer: Self-pay | Admitting: Physician Assistant

## 2017-08-22 DIAGNOSIS — M25422 Effusion, left elbow: Secondary | ICD-10-CM

## 2017-08-22 DIAGNOSIS — M25522 Pain in left elbow: Secondary | ICD-10-CM

## 2018-04-06 ENCOUNTER — Other Ambulatory Visit: Payer: Self-pay | Admitting: Family Medicine

## 2018-04-06 DIAGNOSIS — H93A9 Pulsatile tinnitus, unspecified ear: Secondary | ICD-10-CM

## 2018-04-06 DIAGNOSIS — R42 Dizziness and giddiness: Secondary | ICD-10-CM

## 2018-04-09 ENCOUNTER — Ambulatory Visit
Admission: RE | Admit: 2018-04-09 | Discharge: 2018-04-09 | Disposition: A | Discharge status: Home / Self Care | Payer: BLUE CROSS/BLUE SHIELD | Source: Ambulatory Visit | Attending: Family Medicine | Admitting: Family Medicine

## 2018-04-09 DIAGNOSIS — H93A9 Pulsatile tinnitus, unspecified ear: Secondary | ICD-10-CM

## 2018-04-09 DIAGNOSIS — R42 Dizziness and giddiness: Secondary | ICD-10-CM

## 2018-09-04 ENCOUNTER — Other Ambulatory Visit: Payer: Self-pay | Admitting: Family Medicine

## 2018-09-04 DIAGNOSIS — Z1231 Encounter for screening mammogram for malignant neoplasm of breast: Secondary | ICD-10-CM

## 2019-06-16 ENCOUNTER — Ambulatory Visit: Payer: BC Managed Care – PPO | Admitting: Physician Assistant

## 2019-06-16 ENCOUNTER — Other Ambulatory Visit: Payer: Self-pay

## 2019-06-16 ENCOUNTER — Encounter: Payer: Self-pay | Admitting: Physician Assistant

## 2019-06-16 VITALS — BP 130/70 | HR 72 | Temp 98.4°F | Ht 59.0 in | Wt 133.0 lb

## 2019-06-16 DIAGNOSIS — K219 Gastro-esophageal reflux disease without esophagitis: Secondary | ICD-10-CM | POA: Diagnosis not present

## 2019-06-16 DIAGNOSIS — Z86010 Personal history of colonic polyps: Secondary | ICD-10-CM | POA: Insufficient documentation

## 2019-06-16 DIAGNOSIS — R1013 Epigastric pain: Secondary | ICD-10-CM | POA: Diagnosis not present

## 2019-06-16 NOTE — Progress Notes (Signed)
Subjective:    Patient ID: Jennifer Alvarez, female    DOB: October 08, 1950, 69 y.o.   MRN: [id number]  HPI Jennifer Alvarez is a pleasant 69 year old white female, established with Dr. Havery Moros.  She was last seen in 2018 when she underwent colonoscopy. Patient comes in today after recent episode of epigastric pain.  She has history of chronic GERD and is status post Nissen fundoplication in [id number] with redo in 2013.  She experienced quick return of reflux symptoms after the redo surgery and has required twice daily PPI therapy long-term.  She says this generally does control her symptoms. She underwent EGD last in July 2016 per Dr. Olevia Perches with finding of grade B distal esophagitis and a 4 cm hiatal hernia and functioning fundoplication.  Biopsies from the esophagus show changes consistent with GERD, no Barrett's. Colonoscopy in February 2018 with multiple diverticuli in the left colon, 2 polyps were removed, one was a tubular adenoma, one was hyperplastic. Patient says she started noticing epigastric pain about 3 weeks ago which was intermittent.  It was not associated with any nausea or vomiting or increasing GERD symptoms.  She says if she would press on her upper abdomen pressure seem to help alleviate some of the symptoms.  This bothered her for several days in a row and then after she made the appointment to come here her symptoms have improved and she has not had any discomfort over the past 7 or 8 days.  She has not been on any recent new medications supplements etc. no regular aspirin or NSAIDs.  This pain was always located in the epigastrium and it sometimes would radiate into the right upper quadrant.  Review of Systems Pertinent positive and negative review of systems were noted in the above HPI section.  All other review of systems was otherwise negative.  Outpatient Encounter Medications as of 06/16/2019  Medication Sig  . aspirin EC 81 MG tablet Take 81 mg by mouth daily.  . Calcium-Vitamin D-Vitamin K  500-100-40 MG-UNT-MCG CHEW Chew 2-3 tablets by mouth daily.  . cetirizine (ZYRTEC) 10 MG tablet Take 10 mg by mouth daily.  Marland Kitchen estradiol (MINIVELLE) 0.0375 MG/24HR Place 1 patch onto the skin 2 (two) times a week.  . loratadine (CLARITIN) 10 MG tablet Take 10 mg by mouth.  Marland Kitchen omeprazole (PRILOSEC) 40 MG capsule Take 1 capsule (40 mg total) by mouth 2 (two) times daily. (Patient taking differently: Take 20 mg by mouth daily. )  . PRESCRIPTION MEDICATION Tree serum injection administered by nurse at the patient's office  . sertraline (ZOLOFT) 25 MG tablet Take 25 mg by mouth daily.   Facility-Administered Encounter Medications as of 06/16/2019  Medication  . 0.9 %  sodium chloride infusion   Allergies  Allergen Reactions  . Bee Venom Anaphylaxis  . Peanut-Containing Drug Products Anaphylaxis    Itching around mouth and throat  . Other Swelling    Tree pollen Congestion, watery eyes, swelling to face /eyes  . Vicodin [Hydrocodone-Acetaminophen] Nausea And Vomiting  . Celebrex [Celecoxib] Palpitations  . Demerol Rash  . Valium Rash   Patient Active Problem List   Diagnosis Date Noted  . Hx of adenomatous colonic polyps 06/16/2019  . Abdominal pain, chronic, right upper quadrant 08/28/2012  . Atypical chest pain 08/28/2012  . Post redo Nissen fundoplication August 0000000 01/08/2012  . GERD (gastroesophageal reflux disease) 07/07/2011   Social History   Socioeconomic History  . Marital status: Married    Spouse name: Not on file  .  Number of children: 2  . Years of education: Not on file  . Highest education level: Not on file  Occupational History    Employer: [employer]  Tobacco Use  . Smoking status: Never Smoker  . Smokeless tobacco: Never Used  Substance and Sexual Activity  . Alcohol use: No  . Drug use: No  . Sexual activity: Not on file  Other Topics Concern  . Not on file  Social History Narrative   Daily caffeine    Social Determinants of Health    Financial Resource Strain:   . Difficulty of Paying Living Expenses: Not on file  Food Insecurity:   . Worried About Charity fundraiser in the Last Year: Not on file  . Ran Out of Food in the Last Year: Not on file  Transportation Needs:   . Lack of Transportation (Medical): Not on file  . Lack of Transportation (Non-Medical): Not on file  Physical Activity:   . Days of Exercise per Week: Not on file  . Minutes of Exercise per Session: Not on file  Stress:   . Feeling of Stress : Not on file  Social Connections:   . Frequency of Communication with Friends and Family: Not on file  . Frequency of Social Gatherings with Friends and Family: Not on file  . Attends Religious Services: Not on file  . Active Member of Clubs or Organizations: Not on file  . Attends Archivist Meetings: Not on file  . Marital Status: Not on file  Intimate Partner Violence:   . Fear of Current or Ex-Partner: Not on file  . Emotionally Abused: Not on file  . Physically Abused: Not on file  . Sexually Abused: Not on file    Jennifer Alvarez's family history includes Anxiety disorder in her mother; Asthma in her mother; COPD in her mother; Depression in her mother; Esophageal cancer (age of onset: 82) in her father; Heart disease in her father; Hypertension in her mother.      Objective:    Vitals:   06/16/19 1423  BP: 130/70  Pulse: 72  Temp: 98.4 F (36.9 C)    Physical Exam Well-developed well-nourished female in no acute distress.  , [id number], BMI 26.8  HEENT; nontraumatic normocephalic, EOMI, PER R LA, sclera anicteric. Oropharynx; not examined Neck; supple, no JVD Cardiovascular; regular rate and rhythm with S1-S2, no murmur rub or gallop Pulmonary; Clear bilaterally Abdomen; soft, nontender, nondistended, no palpable mass or hepatosplenomegaly, bowel sounds are active Rectal; not done today Skin; benign exam, no jaundice rash or appreciable lesions Extremities; no clubbing cyanosis  or edema skin warm and dry Neuro/Psych; alert and oriented x4, grossly nonfocal mood and affect appropriate       Assessment & Plan:   #42 69 year old white female with history of prior Nissen fundoplication x2, redo was done in 2013 with very quick recurrence of GERD symptoms and has required chronic twice daily PPI.  #2 recent episode of epigastric pain, intermittent lasting for several days and now resolved.  Etiology not clear rule out mild gastropathy, consider gallbladder disease  #3 history of adenomatous colon polyps-up-to-date with colonoscopy last done 2018 #4 diverticulosis  Plan; continue omeprazole 40 mg p.o. twice daily We will schedule for upper abdominal ultrasound Patient is asked to call should she have any recurrence of pain in the interim.   Sequan Auxier Genia Harold PA-C 06/16/2019   Cc: Stephens Shire, MD

## 2019-06-16 NOTE — Patient Instructions (Signed)
If you are age 69 or older, your body mass index should be between 23-30. Your Body mass index is 26.86 kg/m. If this is out of the aforementioned range listed, please consider follow up with your Primary Care Provider.  If you are age 8 or younger, your body mass index should be between 19-25. Your Body mass index is 26.86 kg/m. If this is out of the aformentioned range listed, please consider follow up with your Primary Care Provider.   You have been scheduled for an abdominal ultrasound at [facility] (1st floor of hospital) on 06/22/19 at 9:30 am. Please arrive 15 minutes prior to your appointment for registration. Make certain not to have anything to eat or drink after midnight the day prior to your appointment. Should you need to reschedule your appointment, please contact radiology at [phone]. This test typically takes about 30 minutes to perform.  Continue Omeprazole 40 mg twice daily.  Call back for recurrent symptoms.  Ask for Amy's nurse.  Thank you for choosing me and [facility].   Amy Esterwood, PA-C

## 2019-06-16 NOTE — Progress Notes (Signed)
Agree with assessment and plan as outlined. If symptoms persist would pursue a follow up endoscopy. Thanks

## 2019-06-22 ENCOUNTER — Ambulatory Visit (HOSPITAL_COMMUNITY): Payer: BC Managed Care – PPO

## 2019-06-28 ENCOUNTER — Other Ambulatory Visit: Payer: Self-pay

## 2019-06-28 ENCOUNTER — Ambulatory Visit (HOSPITAL_COMMUNITY)
Admission: RE | Admit: 2019-06-28 | Discharge: 2019-06-28 | Disposition: A | Discharge status: Home / Self Care | Payer: BC Managed Care – PPO | Source: Ambulatory Visit | Attending: Physician Assistant | Admitting: Physician Assistant

## 2019-06-28 DIAGNOSIS — K219 Gastro-esophageal reflux disease without esophagitis: Secondary | ICD-10-CM | POA: Insufficient documentation

## 2019-06-28 DIAGNOSIS — R1013 Epigastric pain: Secondary | ICD-10-CM

## 2019-10-04 ENCOUNTER — Other Ambulatory Visit: Payer: Self-pay | Admitting: Otolaryngology

## 2019-10-04 DIAGNOSIS — H903 Sensorineural hearing loss, bilateral: Secondary | ICD-10-CM

## 2019-10-14 ENCOUNTER — Other Ambulatory Visit: Payer: Self-pay

## 2019-10-14 ENCOUNTER — Ambulatory Visit
Admission: RE | Admit: 2019-10-14 | Discharge: 2019-10-14 | Disposition: A | Discharge status: Home / Self Care | Payer: BC Managed Care – PPO | Source: Ambulatory Visit | Attending: Otolaryngology | Admitting: Otolaryngology

## 2019-10-14 DIAGNOSIS — H903 Sensorineural hearing loss, bilateral: Secondary | ICD-10-CM

## 2019-10-14 MED ORDER — GADOBENATE DIMEGLUMINE 529 MG/ML IV SOLN
12.0000 mL | Freq: Once | INTRAVENOUS | Status: AC | PRN
  Administered 2019-10-14: 12 mL via INTRAVENOUS

## 2019-11-05 ENCOUNTER — Other Ambulatory Visit: Payer: BC Managed Care – PPO

## 2019-12-07 ENCOUNTER — Encounter: Payer: Self-pay | Admitting: Gastroenterology

## 2020-06-05 ENCOUNTER — Ambulatory Visit: Payer: BC Managed Care – PPO | Admitting: Neurology

## 2020-06-05 ENCOUNTER — Telehealth: Payer: Self-pay | Admitting: Neurology

## 2020-06-05 NOTE — Telephone Encounter (Signed)
Pt called, cancelled appt due icy roads. Rescheduled appt.

## 2020-08-03 ENCOUNTER — Ambulatory Visit: Payer: Medicare Other | Admitting: Neurology

## 2020-08-03 ENCOUNTER — Encounter: Payer: Self-pay | Admitting: Neurology

## 2020-08-03 VITALS — BP 150/75 | HR 64 | Ht 59.0 in | Wt 134.0 lb

## 2020-08-03 DIAGNOSIS — G43809 Other migraine, not intractable, without status migrainosus: Secondary | ICD-10-CM | POA: Diagnosis not present

## 2020-08-03 DIAGNOSIS — R0683 Snoring: Secondary | ICD-10-CM | POA: Diagnosis not present

## 2020-08-03 DIAGNOSIS — H8190 Unspecified disorder of vestibular function, unspecified ear: Secondary | ICD-10-CM | POA: Diagnosis not present

## 2020-08-03 DIAGNOSIS — E663 Overweight: Secondary | ICD-10-CM

## 2020-08-03 DIAGNOSIS — G43009 Migraine without aura, not intractable, without status migrainosus: Secondary | ICD-10-CM | POA: Diagnosis not present

## 2020-08-03 DIAGNOSIS — R351 Nocturia: Secondary | ICD-10-CM

## 2020-08-03 DIAGNOSIS — G4700 Insomnia, unspecified: Secondary | ICD-10-CM

## 2020-08-03 MED ORDER — UBRELVY 50 MG PO TABS: 50.0000 mg | ORAL_TABLET | ORAL | 3 refills | Status: DC | PRN

## 2020-08-03 NOTE — Patient Instructions (Signed)
It was nice to meet you today.    Your neurological exam is normal, you have a slight difficulty with your walking in a straight line but otherwise have a very good neurological exam.    You have had a benign MRI of the brain recently.  You had an updated eye exam as well.    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  I do believe you have a past history of significant migraines.    You may very well have episodic and recurrent vertigo.  This means that it may come back. Please remember, that vertigo can recur without warning, triggers could be stress, sleep deprivation, dehydration and even taking a bumpy car ride, sometimes an acute illness, even a common (viral) cold. It can last hours or days. Please change positions slowly and always stay well-hydrated. Physical therapy with particular attention to vestibular rehabilitation can be very helpful.  You have undergone physical therapy and exercises at home which have helped.  As discussed, we will proceed with a home sleep test to look at your sleep, sometimes underlying sleep apnea can exacerbate dizziness and migraine headaches or cause other headaches especially morning headaches.   Since you snore and have had some recent sleep difficulty, I would favor checking your sleep.  We will call you to set up your home sleep test and also with the results.  If you have obstructive sleep apnea, we can consider treatment such as a CPAP or so-called AutoPap machine.  For as needed use for migraine attack I suggest you try Ubrelvy, 50 mg strength: Take 1 pill at onset of migraine headache, may repeat in 2 hours, no more than 4 pills in 24 hours, i.e. not to exceed 200 mg per 24 hours. May cause sedation and nausea.   Please follow-up routinely in this clinic in 6 months, sooner if needed.  We will likely need prior authorization for your Roselyn Meier.

## 2020-08-03 NOTE — Progress Notes (Signed)
WasSubjective:    Patient ID: Jennifer Alvarez is a 70 y.o. female.  HPI     Star Age, MD, PhD [facility] [address] P[address]O. [place], [place] [address]  Dear Dr. Constance Holster,   I saw your patient, Jennifer Alvarez, upon your kind request, in my Neurologic clinic today for initial consultation of her headaches, concern for migraines, particularly vestibular migraines.  The patient is unaccompanied today.  As you know, Jennifer Alvarez is a 70 year old right-handed woman with an underlying medical history of reflux disease, hearing loss, atypical chest pain, migraine headaches, osteoporosis, seasonal allergies, thyroid disease, and vertigo, who reports a remote history of significant migraines in the 80s.  Her migraines eventually improved, she did not take prescription medicine in the past, was on Tylenol and over-the-counter Advil as needed, pursued biofeedback with good success at the time.  In September 2021 she suddenly started having positional vertigo.  She woke up with symptoms at the time, she was on vacation at the time, she had to cut her vacation short.  She had associated nausea and vomiting, a milder tension type of pressure-like headache in the bitemporal and bifrontal areas intermittently but different from her previous migraines which were severe and throbbing and often one-sided. She had no associated neurological accompaniments at the time.  I reviewed your office notes from 03/16/2020, as well as 09/14/2019.  She reported left ear pain and hearing loss.  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 She also reported episodic vertigo. 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  She had a brain MRI and internal auditory canal MRI with and without contrast on 10/14/2019 and I reviewed the results: IMPRESSION: No cause for hearing loss identified. Negative for vestibular schwannoma. Mild chronic microvascular ischemic change in the white matter. No  acute intracranial abnormality. She had another bout of vertigo in the past, in 2008 or 2009, incidentally, she was on vacation at the time and woke up with symptoms.  She does not recall having a headache at the time.  Overall, she has improved a little bit.  Her vertiginous symptoms have been much less, she has been to physical therapy and had a total of 3 visits, she had positional therapy and also was given exercises for home which have helped.  Sometimes she still has a low-grade pressure-like headache without vertiginous symptoms associated with it.  She takes Tylenol as needed for her headaches, not daily.  She does not take any Advil or nonsteroidal anti-inflammatory medications any longer because of her history of reflux disease, hiatal hernia and status post Nissen fundoplication.  She tries to hydrate well.  She has an updated eye exam and typically goes yearly.  Is due later this fall.  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  She does not always sleep very well.  She is known to snore.  She does not have telltale morning headaches but does not wake up fully rested at times.  She is married and lives with her husband, she is retired from [employer] in [employer].  She is a non-smoker and does not utilize alcohol on a regular basis and has typically no caffeine intake.  She sees [facility] for her eye exams. She has never had any sudden onset of one-sided weakness or numbness or tingling or droopy face or slurring of speech.  She has 2 grown children.  She also has grandchildren.  She has  never had a sleep study.  She has nocturia typically once per average night. Her Past Medical History Is Significant For: Past Medical History:  Diagnosis Date  . Allergy   . Cataract    minor  . Complication of anesthesia    states chipped teeth with intubation  . Fuchs' corneal dystrophy   . Gastroparesis 08/19/00  . GERD (gastroesophageal reflux disease)   . Hearing loss     hearing aids  . Hiatal hernia   . Migraine    migraines in past  . Osteoporosis   . PONV (postoperative nausea and vomiting)   . Seasonal allergies   . 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 Vertigo     Her Past Surgical History Is Significant For: Past Surgical History:  Procedure Laterality Date  . ABDOMINAL HYSTERECTOMY  2005  . BLADDER REPAIR    . BUNIONECTOMY     right foot  . COLONOSCOPY    . LAPAROSCOPIC NISSEN FUNDOPLICATION  1/61/0960   Procedure: LAPAROSCOPIC NISSEN FUNDOPLICATION;  Surgeon: Pedro Earls, MD;  Location: [facility];  Service: General;  Laterality: N/A;  Redo Lap Nissen Fundoplication takedown of herniated nissen fundoplication redo hiatal hernia repair  . NISSEN FUNDOPLICATION  4540   x2 9811, 2013  . PELVIC LAPAROSCOPY     hysterectomy  . RHINOPLASTY  1980    Her Family History Is Significant For: Family History  Problem Relation Age of Onset  . Heart disease Father   . Esophageal cancer Father 27       died age 30  . Hypertension Mother        age 93  . Anxiety disorder Mother   . Depression Mother   . Asthma Mother   . COPD Mother   . Stroke Mother   . Anorexia nervosa Mother   . Colon cancer Neg Hx   . Rectal cancer Neg Hx   . Stomach cancer Neg Hx   . Pancreatic cancer Neg Hx   . Colon polyps Neg Hx     Her Social History Is Significant For: Social History   Socioeconomic History  . Marital status: Married    Spouse name: Jeneen Rinks  . Number of children: 2  . Years of education: Not on file  . Highest education level: Bachelor's degree (e.g., BA, AB, BS)  Occupational History    Employer: [employer]    Comment: retired  Tobacco Use  . Smoking status: Never Smoker  . Smokeless tobacco: Never Used  Vaping Use  . Vaping Use: Never used  Substance and Sexual Activity  . Alcohol use: Never  . Drug use: Never  . Sexual activity: Not on file  Other Topics Concern  . Not on file  Social History Narrative    Lives with husband   No caffeine    Social Determinants of Health   Financial Resource Strain: Not on file  Food Insecurity: Not on file  Transportation Needs: Not on file  Physical Activity: Not on file  Stress: Not on file  Social Connections: Not on file    Her Allergies Are:  Allergies  Allergen Reactions  . Bee Venom Anaphylaxis  . Peanut-Containing Drug Products Anaphylaxis    Itching around mouth and throat  . Other Swelling    Tree pollen Congestion, watery eyes, swelling to face /eyes  . Vicodin [Hydrocodone-Acetaminophen] Nausea And Vomiting  . Celebrex [Celecoxib] Palpitations  . Demerol Rash  . Valium Rash  :  Her Current Medications Are:  Outpatient Encounter Medications as of 08/03/2020  Medication Sig  . amLODipine (NORVASC) 5 MG tablet Take 1 tablet by mouth daily.  Marland Kitchen estradiol (VIVELLE-DOT) 0.025 MG/24HR Place onto the skin.  Marland Kitchen loratadine (CLARITIN) 10 MG tablet Take 10 mg by mouth.  . Multiple Vitamin (MULTI-VITAMIN) tablet Take 1 tablet by mouth daily.  . Multiple Vitamins-Minerals (PRESERVISION AREDS 2 PO) Take 1 tablet by mouth daily.  Marland Kitchen omeprazole (PRILOSEC) 40 MG capsule Take 1 capsule (40 mg total) by mouth 2 (two) times daily. (Patient taking differently: Take 20 mg by mouth daily.)  . PRESCRIPTION MEDICATION Tree serum injection administered by nurse at the patient's office  . sertraline (ZOLOFT) 25 MG tablet Take 25 mg by mouth daily.  . [DISCONTINUED] estradiol (MINIVELLE) 0.0375 MG/24HR Place 1 patch onto the skin 2 (two) times a week.  Marland Kitchen aspirin EC 81 MG tablet Take 81 mg by mouth daily. (Patient not taking: Reported on 08/03/2020)  . [DISCONTINUED] Calcium-Vitamin D-Vitamin K 500-100-40 MG-UNT-MCG CHEW Chew 2-3 tablets by mouth daily.  . [DISCONTINUED] cetirizine (ZYRTEC) 10 MG tablet Take 10 mg by mouth daily.   Facility-Administered Encounter Medications as of 08/03/2020  Medication  . 0.9 %  sodium chloride infusion  :   Review of  Systems:  Out of a complete 14 point review of systems, all are reviewed and negative with the exception of these symptoms as listed below: Review of Systems  Neurological:       New patient, paper referral due to vertigo with migraine. Hx of migraines in 1980's, did Biofeedback which was very successful.     Objective:  Neurological Exam  Physical Exam Physical Examination:   Vitals:   08/03/20 0741  BP: (!) 150/75  Pulse: 64    General Examination: The patient is a very pleasant 70 y.o. female in no acute distress. She appears well-developed and well-nourished and well groomed.  She is without any vertiginous symptoms currently.  HEENT: Normocephalic, atraumatic, pupils are equal, round and reactive to light and accommodation.  No nystagmus or vertigo symptoms with sudden changes in head position today.  Funduscopic exam is normal with sharp disc margins noted. Extraocular tracking is good without limitation to gaze excursion or nystagmus noted. Normal smooth pursuit is noted. Hearing is grossly intact. Face is symmetric with normal facial animation and normal facial sensation. Speech is clear with no dysarthria noted. There is no hypophonia. There is no lip, neck/head, jaw or voice tremor. Neck is supple with full range of passive and active motion. There are no carotid bruits on auscultation. Oropharynx exam reveals: mild mouth dryness, good dental hygiene and mild airway crowding, due to the small airway entry, tonsils are small, Mallampati class III.  Tongue protrudes centrally in palate elevates symmetrically.  Chest: Clear to auscultation without wheezing, rhonchi or crackles noted.  Heart: S1+S2+0, regular and normal without murmurs, rubs or gallops noted.   Abdomen: Soft, non-tender and non-distended with normal bowel sounds appreciated on auscultation.  Extremities: There is no pitting edema in the distal lower extremities bilaterally. Pedal pulses are intact.  Skin: Warm  and dry without trophic changes noted.  Musculoskeletal: exam reveals no obvious joint deformities, tenderness or joint swelling or erythema.   Neurologically:  Mental status: The patient is awake, alert and oriented in all 4 spheres. Her immediate and remote memory, attention, language skills and fund of knowledge are appropriate. There is no evidence of aphasia, agnosia, apraxia or anomia. Speech is clear  with normal prosody and enunciation. Thought process is linear. Mood is normal and affect is normal.  Cranial nerves II - XII are as described above under HEENT exam. In addition: shoulder shrug is normal with equal shoulder height noted. Motor exam: Normal bulk, strength and tone is noted. There is no drift, tremor or rebound. Romberg is negative. Reflexes are 2+ throughout. Babinski: Toes are flexor bilaterally. Fine motor skills and coordination: intact with normal finger taps, normal hand movements, normal rapid alternating patting, normal foot taps and normal foot agility.  Cerebellar testing: No dysmetria or intention tremor on finger to nose testing. Heel to shin is unremarkable bilaterally. There is no truncal or gait ataxia.  Sensory exam: intact to light touch, vibration, temperature sense the upper and lower extremities.  Gait, station and balance: She stands easily. No veering to one side is noted. No leaning to one side is noted. Posture is age-appropriate and stance is narrow based. Gait shows normal stride length and normal pace. No problems turning are noted.  She has slight difficulty with tandem walk.               Assessment and Plan:  In summary, Jennifer Alvarez is a very pleasant 70 y.o.-year old female with an underlying medical history of reflux disease, hearing loss, atypical chest pain, migraine headaches, osteoporosis, seasonal allergies, thyroid disease, and vertigo, who presents for evaluation of her vertigo and migraine headaches, concern for vestibular migraines.  She does  report a remote history of significant migraines in the 80s, she had biofeedback for about 8 weeks in the past which was very helpful.  She does have recurrent recent headaches which are less severe and different from her headaches, recent brain MRI showed benign findings and was reassuring.  She has had at least 2 separate episodes of severe vertigo associated with nausea and vomiting.  Her vertigo episodes were years apart.  With the last episode which was in September 2021 she had some associated headaches, a component of vestibular migraine is certainly possible, she may also have a separate issues altogether, underlying sleep disordered breathing is also a possible contributor to headaches.  We talked about different vertigo triggers and headache triggers today.  She is advised to proceed with a home sleep test to rule out underlying sleep disordered breathing.  She is furthermore advised to try Ubrelvy for as needed use for migraine attacks.  We mutually agreed not to use a preventative at this time as she has done fairly well lately and benefited from physical therapy as well.  We talked about the importance of good hydration, getting enough rest and stress management.  She is advised that we will call her with her home sleep test results once these are available.  If she has obstructive sleep apnea I will likely recommend a CPAP or AutoPap machine.  We will plan a follow-up in 6 months routinely or move up the appointment based on her sleep test results.  I answered all her questions today and she was in agreement.  Thankfully, her exam was benign today.  She was reassured in that regard.  Thank you very much for allowing me to participate in the care of this nice patient. If I can be of any further assistance to you please do not hesitate to call me at [phone].  Sincerely,   Star Age, MD, PhD

## 2020-08-14 ENCOUNTER — Telehealth: Payer: Self-pay

## 2020-08-14 NOTE — Telephone Encounter (Signed)
(  Key: [place])  Your information has been sent to [facility].  PA for Roselyn Meier has been submitted via cover my meds.

## 2020-08-15 NOTE — Telephone Encounter (Signed)
Request Reference Number: [id number]. UBRELVY TAB 50MG  is approved through 04/28/2021. Your patient may now fill this prescription and it will be covered.

## 2020-08-28 ENCOUNTER — Ambulatory Visit (INDEPENDENT_AMBULATORY_CARE_PROVIDER_SITE_OTHER): Payer: Medicare Other | Admitting: Neurology

## 2020-08-28 DIAGNOSIS — R0683 Snoring: Secondary | ICD-10-CM

## 2020-08-28 DIAGNOSIS — R351 Nocturia: Secondary | ICD-10-CM

## 2020-08-28 DIAGNOSIS — G4733 Obstructive sleep apnea (adult) (pediatric): Secondary | ICD-10-CM

## 2020-08-28 DIAGNOSIS — E663 Overweight: Secondary | ICD-10-CM

## 2020-08-28 DIAGNOSIS — G4700 Insomnia, unspecified: Secondary | ICD-10-CM

## 2020-08-28 DIAGNOSIS — G43009 Migraine without aura, not intractable, without status migrainosus: Secondary | ICD-10-CM

## 2020-08-28 DIAGNOSIS — H8190 Unspecified disorder of vestibular function, unspecified ear: Secondary | ICD-10-CM

## 2020-08-28 DIAGNOSIS — G43809 Other migraine, not intractable, without status migrainosus: Secondary | ICD-10-CM

## 2020-08-29 NOTE — Progress Notes (Signed)
See procedure note.

## 2020-09-04 NOTE — Addendum Note (Signed)
Addended by: Star Age on: 09/04/2020 08:26 AM   Modules accepted: Orders

## 2020-09-04 NOTE — Procedures (Signed)
[facility] ([facility] PAT)  STUDY DATE: 08/28/20  DOB: 10-10-1950  MRN: [id number]  ORDERING CLINICIAN: Star Age, MD, PhD   REFERRING CLINICIAN: Dr. Constance Holster   CLINICAL INFORMATION/HISTORY: 70 year old woman with a history of reflux disease, hearing loss, atypical chest pain, migraine headaches, osteoporosis, seasonal allergies, thyroid disease, and vertigo, who reports a remote history of migraines. She reports snoring and not sleeping well.   Epworth sleepiness score: N/A.  BMI: 27.06 kg/m  FINDINGS:   Total Record Time (hours, min): 9 H 57 min  Total Sleep Time (hours, min):  8 H 35 min   Percent REM (%):    21.12 %   Calculated pAHI (per hour):   15/hour  REM pAHI: 20.5   NREM pAHI: 13.5 Supine AHI: 38.1   Oxygen Saturation (%) Mean: 95  Minimum oxygen saturation (%):        80  O2 Saturation Range (%): 80-99  O2Saturation (minutes) <=88%: 0.9 min  Pulse Mean (bpm):    69  Pulse Range (55-107)   IMPRESSION: OSA (obstructive sleep apnea)   RECOMMENDATION:  This home sleep test demonstrates moderate obstructive sleep apnea with a total AHI of 15/hour and O2 nadir of 80%. Moderate to loud snoring was noted. Treatment with positive airway pressure is recommended. The patient will be advised to proceed with an autoPAP titration/trial at home for now. A full night titration study may be considered to optimize treatment settings, if needed down the road. Please note that untreated obstructive sleep apnea may carry additional perioperative morbidity. Patients with significant obstructive sleep apnea should receive perioperative PAP therapy and the surgeons and particularly the anesthesiologist should be informed of the diagnosis and the severity of the sleep disordered breathing. The patient should be cautioned not to drive, work at heights, or operate dangerous or heavy equipment when tired or sleepy. Review and reiteration of good sleep hygiene  measures should be pursued with any patient. Other causes of the patient's symptoms, including circadian rhythm disturbances, an underlying mood disorder, medication effect and/or an underlying medical problem cannot be ruled out based on this test. Clinical correlation is recommended. The patient and her referring provider will be notified of the test results. The patient will be seen in follow up in sleep clinic at [facility].  I certify that I have reviewed the raw data recording prior to the issuance of this report in accordance with the standards of the American Academy of Sleep Medicine (AASM).  INTERPRETING PHYSICIAN:  Star Age, MD, PhD  Board Certified in Neurology and Sleep Medicine  [facility] [address] [place], [place] [address] [phone]  Sleep Summary  Oxygen Saturation Statistics   Start Study Time: End Study Time: Total Recording Time:  7:21:58 PM 5:19:21 AM 9 h, 57 min  Total Sleep Time % REM of Sleep Time:  8 h, 35 min  21.1    Mean: 95 Minimum: 80 Maximum: 99  Mean of Desaturations Nadirs (%):   92  Oxygen Desatur. %:  4-9 10-20 >20 Total  Events Number Total   85  5 94.4 5.6  0 0.0  90 100.0  Oxygen Saturation: <90 <=88 <85 <80 <70  Duration (minutes): Sleep % 1.6 0.3 0.9 0.1 0.2 0.0 0.0 0.0 0.0 0.0     Respiratory Indices      Total Events REM NREM All Night  pRDI: pAHI 3%: ODI 4%: pAHIc 3%: % CSR: pAHI 4%:  137  128  90  8 0.0 89 21.6 20.5 18.3 1.1 14.5 13.5 8.4 1.1 16.0 15.0 10.5 1.1 0 10.4       Pulse Rate Statistics during Sleep (BPM)      Mean: 69 Minimum: 55 Maximum: 107        Body Position Statistics  Position Supine Prone Right Left Non-Supine  Sleep (min) 60.0 0.0 291.9 53.0 344.9  Sleep % 11.6 0.0 56.6 10.3 66.9  pRDI 38.1 N/A 10.7 12.5 11.0  pAHI 3% 38.1 N/A 9.1 12.5 9.6  ODI 4% 31.1 N/A 6.2 9.1 6.6     Snoring Statistics Snoring Level (dB) >40 >50 >60 >70 >80 >Threshold  (45)  Sleep (min) 336.0 141.1 39.6 0.4 0.0 189.7  Sleep % 65.2 27.4 7.7 0.1 0.0 36.8    Mean: 46 dB

## 2020-09-04 NOTE — Progress Notes (Signed)
Patient referred by Dr. Constance Holster for migraine HAs, seen by me on 08/03/20, patient had a HST on 08/28/20.    Please call and notify the patient that the recent home sleep test showed obstructive sleep apnea in the moderate range. I recommend treatment in the form of autoPAP, which means, that we don't have to bring her in for a sleep study with CPAP, but will let her start using a so called autoPAP machine at home, which is a CPAP-like machine with self-adjusting pressures. We will send the order to a local DME company (of her choice, or as per insurance requirement). The DME representative will fit her with a mask, educate her on how to use the machine, how to put the mask on, etc. I have placed an order in the chart. Please send the order, talk to patient, send report to referring MD. We will need a FU in sleep clinic for 10 weeks post-PAP set up, please arrange that with me or one of our NPs. Also reinforce the need for compliance with treatment. Thanks,   Star Age, MD, PhD [facility])

## 2020-09-05 ENCOUNTER — Telehealth: Payer: Self-pay

## 2020-09-05 NOTE — Telephone Encounter (Signed)
I called pt. I advised pt that Dr. Rexene Alberts reviewed their sleep study results and found that pt has moderate OSA. Dr. Rexene Alberts recommends that pt start on autopap for treatment. I reviewed PAP compliance expectations with the pt. Pt is agreeable to starting an auto-PAP. I advised pt that an order will be sent to a DME, [facility], and [facility] will call the pt within about one week after they file with the pt's insurance. [facility] will show the pt how to use the machine, fit for masks, and troubleshoot the auto-PAP if needed.Pt was made aware of the national shortage of machines and that start dates can be 6-12 weeks out. Pt will CB once she is started on her machine to scheduled f/u appt.. A letter with all of this information in it will be mailed to the pt as a reminder. I verified with the pt that the address we have on file is correct. Pt verbalized understanding of results. Pt had no questions at this time but was encouraged to call back if questions arise. I have sent the order to [facility] and have received confirmation that they have received the order.

## 2020-09-05 NOTE — Telephone Encounter (Signed)
-----   Message from Star Age, MD sent at 09/04/2020  8:26 AM EDT ----- Patient referred by Dr. Constance Holster for migraine HAs, seen by me on 08/03/20, patient had a HST on 08/28/20.    Please call and notify the patient that the recent home sleep test showed obstructive sleep apnea in the moderate range. I recommend treatment in the form of autoPAP, which means, that we don't have to bring her in for a sleep study with CPAP, but will let her start using a so called autoPAP machine at home, which is a CPAP-like machine with self-adjusting pressures. We will send the order to a local DME company (of her choice, or as per insurance requirement). The DME representative will fit her with a mask, educate her on how to use the machine, how to put the mask on, etc. I have placed an order in the chart. Please send the order, talk to patient, send report to referring MD. We will need a FU in sleep clinic for 10 weeks post-PAP set up, please arrange that with me or one of our NPs. Also reinforce the need for compliance with treatment. Thanks,   Star Age, MD, PhD [facility])

## 2020-09-19 ENCOUNTER — Ambulatory Visit: Payer: Medicare Other | Admitting: Gastroenterology

## 2020-09-19 ENCOUNTER — Encounter: Payer: Self-pay | Admitting: Gastroenterology

## 2020-09-19 VITALS — BP 146/74 | HR 72 | Ht 58.75 in | Wt 132.2 lb

## 2020-09-19 DIAGNOSIS — K449 Diaphragmatic hernia without obstruction or gangrene: Secondary | ICD-10-CM | POA: Diagnosis not present

## 2020-09-19 DIAGNOSIS — R1310 Dysphagia, unspecified: Secondary | ICD-10-CM

## 2020-09-19 DIAGNOSIS — K2100 Gastro-esophageal reflux disease with esophagitis, without bleeding: Secondary | ICD-10-CM | POA: Diagnosis not present

## 2020-09-19 DIAGNOSIS — R6881 Early satiety: Secondary | ICD-10-CM

## 2020-09-19 NOTE — Patient Instructions (Addendum)
If you are age 70 or older, your body mass index should be between 23-30. Your Body mass index is 26.94 kg/m. If this is out of the aforementioned range listed, please consider follow up with your Primary Care Provider.  If you are age 26 or younger, your body mass index should be between 19-25. Your Body mass index is 26.94 kg/m. If this is out of the aformentioned range listed, please consider follow up with your Primary Care Provider.   You have been scheduled for a colonoscopy. Please follow written instructions given to you at your visit today.  Please pick up your prep supplies at the pharmacy within the next 1-3 days. If you use inhalers (even only as needed), please bring them with you on the day of your procedure.  Increase omeprazole to twice a day.  Thank you for entrusting me with your care and for choosing [facility], Dr. [place]     The [facility] providers would like to encourage you to use [facility] to communicate with providers for non-urgent requests or questions.  Due to long hold times on the telephone, sending your provider a message by [facility] may be a faster and more efficient way to get a response.  Please allow 48 business hours for a response.  Please remember that this is for non-urgent requests.

## 2020-09-19 NOTE — Progress Notes (Signed)
HPI :  70 year old female here for follow-up visit.  She was last seen in February of last year for epigastric pain, reflux.  She has a history of chronic reflux, had a Nissen fundoplication she states around 2003 or so.  She states she did quite well with the initial surgery but over time symptoms came back.  She had a Nissen fundoplication redo in 9163, she states this "did nothing".  She was concerned the redo never took and that her symptoms have persisted.  Main reflux symptoms are pyrosis and regurgitation.  During the day she does not have much problems that bothers her and when she has trigger foods.  Her main problem is at nighttime.  She stops eating around 530, plenty of time before she goes to bed.  She sleeps with 4 pillows under the head of her bed.  She continues to have regurgitation at night when she sleeping.  Sometimes this can come up into her mouth and she has to spit out food.  She also has occasional dysphagia to solids and sometimes has to spit food out in that setting as well.  She feels full easily which has been ongoing for a while but has a good appetite.  She thinks she is lost 6 or 7 pounds over the past 5 weeks.  She is taking omeprazole 40 mg a day.  She states that she was not on this she would feel much worse, but despite it she has significant symptoms on a daily basis.  No blood in her stools.  She states her bowel habits are okay.  She does have a history of osteopenia, worries about bone fracture.  She denies any history of bone fractures.  She has been undergoing a work-up for vertigo recently has been seeing neurology and ENT.  She was told she had vestibular migraines and was recently diagnosed with sleep apnea as well.  On review of chart it appears she had an abnormal gastric emptying study in 2002.  She cannot recall if she has ever tried Reglan before.   EGD - July 2016 per Dr. Olevia Perches with finding of grade B distal esophagitis and a 4 cm hiatal hernia and  functioning fundoplication.  Biopsies from the esophagus show changes consistent with GERD, no Barrett's.  Colonoscopy - February 2018 with multiple diverticuli in the left colon, 2 polyps were removed, one was a tubular adenoma, one was hyperplastic.  [place] 06/28/19 - IMPRESSION: GB normal no stones Probable fatty infiltration of liver as above. Otherwise negative exam.  CT abdomen / pelvis 07/29/2016: IMPRESSION: 1. A specific cause for chronic right lower quadrant pain is not identified. 2. Distal esophageal wall thickening (probably from esophagitis) with a small to moderate-sized hiatal hernia and what appears to be a partially un wrapped Nissen fundoplication also herniated as part of the hiatal hernia. 3. Left foraminal impingement at L5-S1 due to spurring. 4. Sigmoid diverticulosis without active diverticulitis. 5.  Prominent stool throughout the colon favors constipation.     Past Medical History:  Diagnosis Date  . Allergy   . Cataract    minor  . Complication of anesthesia    states chipped teeth with intubation  . Fuchs' corneal dystrophy   . Gastroparesis 08/19/00  . GERD (gastroesophageal reflux disease)   . Hearing loss    hearing aids  . Hiatal hernia   . Migraine    migraines in past  . Osteoporosis   . PONV (postoperative nausea and vomiting)   .  Seasonal allergies   . Sleep apnea with use of continuous positive airway pressure (CPAP)   . 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 Vertigo      Past Surgical History:  Procedure Laterality Date  . ABDOMINAL HYSTERECTOMY  2005  . BLADDER REPAIR    . BUNIONECTOMY     right foot  . COLONOSCOPY    . LAPAROSCOPIC NISSEN FUNDOPLICATION  5/85/2778   Procedure: LAPAROSCOPIC NISSEN FUNDOPLICATION;  Surgeon: Pedro Earls, MD;  Location: [facility];  Service: General;  Laterality: N/A;  Redo Lap Nissen Fundoplication takedown of herniated nissen fundoplication redo hiatal hernia repair  . NISSEN FUNDOPLICATION   2423   x2 2002, 2013  . PELVIC LAPAROSCOPY     hysterectomy  . RHINOPLASTY  1980   Family History  Problem Relation Age of Onset  . Heart disease Father   . Esophageal cancer Father 66       died age 7  . Hypertension Mother        age 35  . Anxiety disorder Mother   . Depression Mother   . Asthma Mother   . COPD Mother   . Stroke Mother   . Anorexia nervosa Mother   . Colon cancer Neg Hx   . Rectal cancer Neg Hx   . Stomach cancer Neg Hx   . Pancreatic cancer Neg Hx   . Colon polyps Neg Hx    Social History   Tobacco Use  . Smoking status: Never Smoker  . Smokeless tobacco: Never Used  Vaping Use  . Vaping Use: Never used  Substance Use Topics  . Alcohol use: Never  . Drug use: Never   Current Outpatient Medications  Medication Sig Dispense Refill  . amLODipine (NORVASC) 5 MG tablet Take 1 tablet by mouth daily.    Marland Kitchen aspirin EC 81 MG tablet Take 81 mg by mouth as needed.    Marland Kitchen estradiol (VIVELLE-DOT) 0.025 MG/24HR Place 1 patch onto the skin 2 (two) times a week.    . loratadine (CLARITIN) 10 MG tablet Take 10 mg by mouth daily.    . Multiple Vitamin (MULTI-VITAMIN) tablet Take 1 tablet by mouth daily.    . Multiple Vitamins-Minerals (PRESERVISION AREDS 2 PO) Take 1 tablet by mouth daily.    Marland Kitchen omeprazole (PRILOSEC) 40 MG capsule Take 40 mg by mouth daily.    Marland Kitchen PRESCRIPTION MEDICATION Tree serum injection administered by nurse at the patient's office    . sertraline (ZOLOFT) 25 MG tablet Take 25 mg by mouth daily.     Current Facility-Administered Medications  Medication Dose Route Frequency Provider Last Rate Last Admin  . 0.9 %  sodium chloride infusion  500 mL Intravenous Continuous Meara Wiechman, Carlota Raspberry, MD       Allergies  Allergen Reactions  . Bee Venom Anaphylaxis  . Peanut-Containing Drug Products Anaphylaxis    Itching around mouth and throat  . Other Swelling    Tree pollen Congestion, watery eyes, swelling to face /eyes  . Vicodin  [Hydrocodone-Acetaminophen] Nausea And Vomiting  . Celebrex [Celecoxib] Palpitations  . Demerol Rash  . Valium Rash     Review of Systems: All systems reviewed and negative except where noted in HPI.   Labs reviewed in care everywhere  CBC, CMET normal, TSH   Physical Exam: BP (!) 146/74 (BP Location: Left Arm, Patient Position: Sitting, Cuff Size: Normal)   Pulse 72   Ht 4' 10.75" (1.492 m) Comment: height measured  without shoes  Wt 132 lb 4 oz (60 kg)   BMI 26.94 kg/m  Constitutional: Pleasant,well-developed, female in no acute distress. Neurological: Alert and oriented to person place and time. Psychiatric: Normal mood and affect. Behavior is normal.   ASSESSMENT AND PLAN: 70 year old female here for reassessment of the following:  GERD Hiatal hernia Dysphagia Early Satiety  Longstanding history of reflux as above.  Unfortunately she has had 2 Nissen fundoplications with persistent symptoms.  Last EGD as outlined above showed 4 cm hiatal hernia with active esophagitis.  She has significant nocturnal symptoms at this time with occasional dysphagia and baseline early satiety.  We reviewed her history.  She does have a history of reported gastroparesis back in 2002, unclear how much of a role explain her current symptoms.  She could have had worsening hiatal hernia since her last exam as well or loosening of the fundoplication.  She has a family history of esophageal cancer.  I am recommending an EGD to reevaluate her esophagus, ensure no Barrett's esophagus, reassess the anatomy to determine the best course of therapy moving forward.  She is on chronic omeprazole and has concerns about taking that long-term.  I discussed we must balance the risks versus benefits of the medication.  I went through the risks of the medication at length with her, I think generally low and if she comes off PPI without another good treatment option, her symptoms will very likely worsen.  In fact I  think she may benefit from increasing her dose of PPI to twice daily.  She was agreeable to this.  If her hiatal hernia has significantly enlarged or fundoplication quite loose, could entertain having her see a surgeon again although her want to avoid doing that if at all possible.  We can also may consider giving her a trial of Reglan for her history of gastroparesis.  She wants to wait the EGD first prior to adding a new medication to her regimen.  We discussed EGD, risks and benefits of it, we had an opening for tomorrow afternoon and will get this done quickly for her.  Further recommendations pending results.  Plan: - EGD tomorrow PM - increase omeprazole to BID dosing, counseled on risks / benefits, long term want to use the lowest dose needed - discussed adding carafate or reglan PRN - will await EGD  Charlotte Harbor Cellar, MD [facility]

## 2020-09-20 ENCOUNTER — Other Ambulatory Visit: Payer: Self-pay

## 2020-09-20 ENCOUNTER — Ambulatory Visit (AMBULATORY_SURGERY_CENTER): Payer: Medicare Other | Admitting: Gastroenterology

## 2020-09-20 ENCOUNTER — Encounter: Payer: Self-pay | Admitting: Gastroenterology

## 2020-09-20 VITALS — BP 117/63 | HR 65 | Temp 98.4°F | Resp 13 | Ht 58.75 in | Wt 132.0 lb

## 2020-09-20 DIAGNOSIS — K2270 Barrett's esophagus without dysplasia: Secondary | ICD-10-CM | POA: Diagnosis not present

## 2020-09-20 DIAGNOSIS — K219 Gastro-esophageal reflux disease without esophagitis: Secondary | ICD-10-CM | POA: Diagnosis not present

## 2020-09-20 DIAGNOSIS — K449 Diaphragmatic hernia without obstruction or gangrene: Secondary | ICD-10-CM | POA: Diagnosis not present

## 2020-09-20 MED ORDER — METOCLOPRAMIDE HCL 5 MG PO TABS: 5.0000 mg | ORAL_TABLET | Freq: Every day | ORAL | 3 refills | Status: DC

## 2020-09-20 MED ORDER — SODIUM CHLORIDE 0.9 % IV SOLN: 500.0000 mL | Freq: Once | INTRAVENOUS | Status: DC

## 2020-09-20 NOTE — Op Note (Signed)
[place] Patient Name: Jennifer Alvarez Procedure Date: 09/20/2020 3:23 PM MRN: [id number] Endoscopist: Remo Lipps P. Havery Moros , MD Age: 70 Referring MD:  Date of Birth: [birth year] Gender: Female Account #: [id number] Procedure:                Upper GI endoscopy Indications:              Follow-up of gastro-esophageal reflux disease -                            persistent nocturnal regurgitation, occasional                            dysphagia - history of Nissen remotely with a redo                            in 2013, on omeprazole once daily with breakthrough                            symptoms. Also with remote history of abnormal GES,                            with early satiety Medicines:                Monitored Anesthesia Care Procedure:                Pre-Anesthesia Assessment:                           - Prior to the procedure, a History and Physical                            was performed, and patient medications and                            allergies were reviewed. The patient's tolerance of                            previous anesthesia was also reviewed. The risks                            and benefits of the procedure and the sedation                            options and risks were discussed with the patient.                            All questions were answered, and informed consent                            was obtained. Prior Anticoagulants: The patient has                            taken no previous anticoagulant or antiplatelet  agents. ASA Grade Assessment: II - A patient with                            mild systemic disease. After reviewing the risks                            and benefits, the patient was deemed in                            satisfactory condition to undergo the procedure.                           After obtaining informed consent, the endoscope was                            passed under direct vision.  Throughout the                            procedure, the patient's blood pressure, pulse, and                            oxygen saturations were monitored continuously. The                            Endoscope was introduced through the mouth, and                            advanced to the second part of duodenum. The upper                            GI endoscopy was accomplished without difficulty.                            The patient tolerated the procedure well. Scope In: Scope Out: Findings:                 Esophagogastric landmarks were identified: the                            Z-line was found at 31 cm, the gastroesophageal                            junction was found at 31 cm and the upper extent of                            the gastric folds was found at 35 cm from the                            incisors.                           A 4 cm hiatal hernia was present.  The Z-line was irregular and was found 35 cm from                            the incisors, with a tongue of salmon colored                            mucosa about 1cm in length. Biopsies were taken                            with a cold forceps for histology.                           The examined esophagus was tortuous with an                            angulated turn entering the hernia sac.                           The exam of the esophagus was otherwise normal. No                            stenosis / stricture appreciated.                           Evidence of a Nissen fundoplication was found in                            the cardia. It was slightly loose.                           The exam of the stomach was otherwise normal.                           The duodenal bulb and second portion of the                            duodenum were normal. Complications:            No immediate complications. Estimated blood loss:                            Minimal. Estimated Blood Loss:      Estimated blood loss was minimal. Impression:               - Esophagogastric landmarks identified.                           - 4 cm hiatal hernia.                           - Z-line irregular, 35 cm from the incisors.                            Biopsied.                           -  Angulated turn entering hernia sac, otherwise                            normal esophagus                           - A Nissen fundoplication was found as above                           - Normal stomach otherwise.                           - Normal duodenal bulb and second portion of the                            duodenum.                           Patient may have some underlying gastroparesis, in                            addition to hiatal hernia, predisposing her to                            worsening reflux. No erosive esophagitis on this                            exam however. Dysphagia I think is from angulated                            turn at lower esophagus entering hernia sac, no                            obvious stenosis / stricture appreciated. Recommendation:           - Patient has a contact number available for                            emergencies. The signs and symptoms of potential                            delayed complications were discussed with the                            patient. Return to normal activities tomorrow.                            Written discharge instructions were provided to the                            patient.                           - Resume previous diet.                           -  Continue present medications.                           - Increase omeprazole to twice daily dosing as                            discussed in the clinic                           - Trial of Reglan 5mg  qHS to see if that will help                            reduce nocturnal symptoms                           - Await pathology results and course on regimen                             above. Remo Lipps P. Lasheika Ortloff, MD 09/20/2020 3:43:45 PM This report has been signed electronically.

## 2020-09-20 NOTE — Progress Notes (Signed)
Report given to PACU, vss 

## 2020-09-20 NOTE — Progress Notes (Signed)
Called to room to assist during endoscopic procedure.  Patient ID and intended procedure confirmed with present staff. Received instructions for my participation in the procedure from the performing physician.  

## 2020-09-20 NOTE — Patient Instructions (Signed)
Handout provided on hiatal hernia.   Increase Omeprazole to twice daily dosing as discussed in clinic. (No new prescription at this time for the omeprazole, just use what you currently have for twice daily. )  Trail of Reglan 5mg  every night at bedtime to see if that will help reduce nocturnal symptoms. (prescription sent to your pharmacy)  YOU HAD AN ENDOSCOPIC PROCEDURE TODAY AT [place]:   Refer to the procedure report that was given to you for any specific questions about what was found during the examination.  If the procedure report does not answer your questions, please call your gastroenterologist to clarify.  If you requested that your care partner not be given the details of your procedure findings, then the procedure report has been included in a sealed envelope for you to review at your convenience later.  YOU SHOULD EXPECT: Some feelings of bloating in the abdomen. Passage of more gas than usual.  Walking can help get rid of the air that was put into your GI tract during the procedure and reduce the bloating. If you had a lower endoscopy (such as a colonoscopy or flexible sigmoidoscopy) you may notice spotting of blood in your stool or on the toilet paper. If you underwent a bowel prep for your procedure, you may not have a normal bowel movement for a few days.  Please Note:  You might notice some irritation and congestion in your nose or some drainage.  This is from the oxygen used during your procedure.  There is no need for concern and it should clear up in a day or so.  SYMPTOMS TO REPORT IMMEDIATELY:   Following upper endoscopy (EGD)  Vomiting of blood or coffee ground material  New chest pain or pain under the shoulder blades  Painful or persistently difficult swallowing  New shortness of breath  Fever of 100F or higher  Black, tarry-looking stools  For urgent or emergent issues, a gastroenterologist can be reached at any hour by calling [phone]  [phone]. Do not use MyChart messaging for urgent concerns.    DIET:  We do recommend a small meal at first, but then you may proceed to your regular diet.  Drink plenty of fluids but you should avoid alcoholic beverages for 24 hours.  ACTIVITY:  You should plan to take it easy for the rest of today and you should NOT DRIVE or use heavy machinery until tomorrow (because of the sedation medicines used during the test).    FOLLOW UP: Our staff will call the number listed on your records 48-72 hours following your procedure to check on you and address any questions or concerns that you may have regarding the information given to you following your procedure. If we do not reach you, we will leave a message.  We will attempt to reach you two times.  During this call, we will ask if you have developed any symptoms of COVID 19. If you develop any symptoms (ie: fever, flu-like symptoms, shortness of breath, cough etc.) before then, please call [phone].  If you test positive for Covid 19 in the 2 weeks post procedure, please call and report this information to [place].    If any biopsies were taken you will be contacted by phone or by letter within the next 1-3 weeks.  Please call us at [phone] if you have not heard about the biopsies in 3 weeks.    SIGNATURES/CONFIDENTIALITY: You and/or your care partner have signed paperwork which will  be entered into your electronic medical record.  These signatures attest to the fact that that the information above on your After Visit Summary has been reviewed and is understood.  Full responsibility of the confidentiality of this discharge information lies with you and/or your care-partner.

## 2020-09-20 NOTE — Progress Notes (Signed)
Pt's states no medical or surgical changes since previsit or office visit. 

## 2020-09-22 ENCOUNTER — Telehealth: Payer: Self-pay

## 2020-09-22 NOTE — Telephone Encounter (Signed)
  Follow up Call-  Call back number 09/20/2020  Post procedure Call Back phone  # [phone]  Permission to leave phone message Yes  Some recent data might be hidden     Patient questions:  Do you have a fever, pain , or abdominal swelling? No. Pain Score  0 *  Have you tolerated food without any problems? Yes.    Have you been able to return to your normal activities? Yes.    Do you have any questions about your discharge instructions: Diet   No. Medications  No. Follow up visit  No.  Do you have questions or concerns about your Care? no  Actions: * If pain score is 4 or above: No action needed, pain <4. 1. Have you developed a fever since your procedure? no  2.   Have you had an respiratory symptoms (SOB or cough) since your procedure? no  3.   Have you tested positive for COVID 19 since your procedure no  4.   Have you had any family members/close contacts diagnosed with the COVID 19 since your procedure?  no   If yes to any of these questions please route to Joylene John, RN and Joella Prince, RN

## 2020-10-03 ENCOUNTER — Telehealth: Payer: Self-pay | Admitting: Gastroenterology

## 2020-10-03 NOTE — Telephone Encounter (Signed)
Inbound call from patient inquiring about lab results. Best contact number [phone]

## 2020-10-03 NOTE — Telephone Encounter (Signed)
I just saw her results today as I have been out of the office the past few days. I sent her a Mychart message with recommendations as follows:  Dear Ms. Portilla,  This message is to relay the results of your recent endoscopy.  You were noted to have a slightly loose Nissen fundoplication, and a hiatal hernia likely causing your reflux symptoms in the setting of having a history of slow emptying of the stomach.  You had a slight irregularity at the bottom of your esophagus where it connects your stomach.  Biopsies of this area are consistent with what is called "Barrett's esophagus".  This is considered a precancerous change where the cells that normally line the esophagus turn into cells that normally line the small intestine.  This is a risk factor for the development of esophageal cancer, although the overall risk for development of esophagus cancer in this setting is extremely low with a short segment.  That being said, the omeprazole that you take it can help reduce the risk for esophageal cancer in the setting and recommend that you continue it.  In fact, we had discussed increasing the dose to twice daily at the office visit to see if that provided more benefit to reflux symptoms.  Moving forward, we will need to perform another endoscopy in 3 years to survey this area to make sure there is no changes in regards to the Barrett's esophagus.  Otherwise, in regards to your symptoms, we discussed a trial of Reglan 5 mg every night to see if that would help your nighttime symptoms in the interim.  Lets see how this goes for a few weeks and you can contact me if your symptoms persist despite this.  Please let me know if you have any questions in the interim or worsening of symptoms otherwise.  Thanks  Dr. Havery Moros

## 2020-10-03 NOTE — Telephone Encounter (Signed)
Dr. Havery Moros, patient is calling in regards to pathology results. Please advise, thanks.

## 2020-10-04 NOTE — Telephone Encounter (Signed)
Unfortunately while I understand she has osteopenia, we recommend that she stay on omeprazole indefinitely because that can reduce the risk of esophagus cancer in the setting of Barrett's esophagus.  The question is the dosing of omeprazole.  I would like her to use the lowest dose that she needs to to control her symptoms, given she had worsening symptoms recently we had increased the dose.  She can titrate down the dose to once a day if she tolerates and lowest dose to be 20 mg a day at some point if she can tolerate that.  Overall need to weigh the risks and benefits of the medication, the medication can slightly increase the risk for bone fracture but I feel the benefits likely outweigh the risks unless she feels otherwise or has had significant fractures in the past.  Hopefully this makes sense, I am happy to see her in follow-up at the office visit to discuss long-term plan for this otherwise.

## 2020-10-04 NOTE — Telephone Encounter (Signed)
Spoke with patient in regards to recommendations. Advised patient to give [place] a call back if she has any concerns. Patient verbalized understanding and had no concerns at the end of the call.

## 2020-10-04 NOTE — Telephone Encounter (Signed)
Lm on vm for patient to return call 

## 2020-10-04 NOTE — Telephone Encounter (Addendum)
Spoke with patient in regards to her pathology results and recommendations. Patient wanted to know how long you wanted her to be on the Omeprazole 40 mg BID. She has concerns because she has osteopenia. Pt reports that her father passed away from esophageal cancer at the age of 73, she reports that he was diagnosed at the age of 74/88. Pt states that her father was a smoker so she understands the additional risks that he father had. Please advise, thanks.

## 2020-11-17 ENCOUNTER — Telehealth: Payer: Self-pay | Admitting: Gastroenterology

## 2020-11-17 ENCOUNTER — Other Ambulatory Visit: Payer: Self-pay | Admitting: *Deleted

## 2020-11-17 MED ORDER — OMEPRAZOLE 40 MG PO CPDR: 40.0000 mg | DELAYED_RELEASE_CAPSULE | Freq: Two times a day (BID) | ORAL | 1 refills | Status: DC

## 2020-11-17 MED ORDER — OMEPRAZOLE 40 MG PO CPDR: 40.0000 mg | DELAYED_RELEASE_CAPSULE | Freq: Two times a day (BID) | ORAL | 0 refills | Status: DC

## 2020-11-17 NOTE — Telephone Encounter (Signed)
Inbound call from patient stating she needs updated script for omeprazole to be sent to pharmacy since it was increased from her last visit and has now ran out.  Please advise.

## 2020-11-17 NOTE — Telephone Encounter (Signed)
Updated prescription for the increase of omeprazole sent to patient's pharmacy. Attempt to contact patient to notify her, call rang and no one answered. Could not leave message.

## 2020-12-04 ENCOUNTER — Encounter: Payer: Self-pay | Admitting: Neurology

## 2020-12-04 ENCOUNTER — Ambulatory Visit: Payer: Medicare Other | Admitting: Neurology

## 2020-12-04 ENCOUNTER — Other Ambulatory Visit: Payer: Self-pay

## 2020-12-04 VITALS — BP 136/77 | HR 59 | Ht 59.0 in | Wt 133.4 lb

## 2020-12-04 DIAGNOSIS — G4733 Obstructive sleep apnea (adult) (pediatric): Secondary | ICD-10-CM

## 2020-12-04 DIAGNOSIS — H8190 Unspecified disorder of vestibular function, unspecified ear: Secondary | ICD-10-CM | POA: Diagnosis not present

## 2020-12-04 DIAGNOSIS — G43009 Migraine without aura, not intractable, without status migrainosus: Secondary | ICD-10-CM

## 2020-12-04 DIAGNOSIS — Z9989 Dependence on other enabling machines and devices: Secondary | ICD-10-CM | POA: Diagnosis not present

## 2020-12-04 NOTE — Patient Instructions (Signed)
It was nice to see you again today.  I am glad to hear that you have not had any severe migraine attack or vertigo attack.  Please continue to hydrate well with water and change positions slowly.  You can continue to use Tylenol as needed for headaches.  If you should have escalation of your headache to the point of a migraine, we can consider an acute prescription medicine.  I am sorry to hear that you are not able to fill the Ubrelvy due to cost.  It sounds like you have been adjusting well to your AutoPap machine.  We will update you with information on the download once we have the data from the machine.  Please continue using your autoPAP regularly. While your insurance requires that you use PAP at least 4 hours each night on 70% of the nights, I recommend, that you not skip any nights and use it throughout the night if you can. Getting used to PAP and staying with the treatment long term does take time and patience and discipline. Untreated obstructive sleep apnea when it is moderate to severe can have an adverse impact on cardiovascular health and raise her risk for heart disease, arrhythmias, hypertension, congestive heart failure, stroke and diabetes. Untreated obstructive sleep apnea causes sleep disruption, nonrestorative sleep, and sleep deprivation. This can have an impact on your day to day functioning and cause daytime sleepiness and impairment of cognitive function, memory loss, mood disturbance, and problems focussing. Using PAP regularly can improve these symptoms.  Please follow-up routinely to see one of our nurse practitioners in about 6 months.  If all is well at the time, we can see you on a yearly basis.

## 2020-12-04 NOTE — Progress Notes (Signed)
Subjective:    Patient ID: Jennifer Alvarez is a 70 y.o. female.  HPI    Interim history:   Jennifer Alvarez is a 70 year old right-handed woman with an underlying medical history of reflux disease, hearing loss, atypical chest pain, migraine headaches, osteoporosis, seasonal allergies, thyroid disease, and vertigo, who presents for follow-up consultation of her migraines and sleep apnea.  The patient is accompanied by her husband today.  I first met her on 08/03/2020 at the request of Dr. Constance Holster in the ENT for concern for vertiginous migraines.  She reported positional vertigo since September 2021 and a longstanding history of migraines going back to the 80s.  Her migraines eventually improved.  She also had noted improvement of her vertigo with physical therapy.  She had a recent brain MRI.  She was advised to use Ubrelvy as needed for migraine attacks.  She was advised to proceed with a sleep study to rule out sleep apnea as a cause for headaches.  She had a home sleep test on 08/28/2020 which indicated moderate obstructive sleep apnea with an AHI of 15/h, O2 nadir of 80%.  She was advised to start AutoPap therapy.    Today, 12/04/2020: I was not able to review her AutoPap data today as the serial number was not typed incorrectly and we were not tagged correctly.  Patient reports that she is using her machine nightly, she has had some difficulty adjusting, initially the machine was turned off spontaneously a few times and she had to take it back to the DME provider.  There was another occasion where the machine turned on spontaneously during the day.  She took the machine back.  She was told that she may need a replacement if she had ongoing issues with it.  She reports some modest improvement in her sleep consolidation and daytime energy.  She no longer snores audibly per husband.  She had to adjust the mask because of leaking.  The mouth is quite dry in the mornings although she has the humidity on a higher  setting.  The humidifier should be worn but she reports that she only is feeling cool air.  She may need to get this looked at again by the DME provider.  As far as her migraines, she has not had a migraine attack, she could not fill the [place] due to cost.  She has not even had to use Tylenol, she has had an occasional frontal pressure-like headache but it does not last more than 10 or 15 minutes at a time.  She has had occasional positional dizziness which is brief, typically when she is standing up too quickly but no significant vertigo-like attack.  She has been using bilateral hearing aids.  She has not had any new concerns.  She has not had a follow-up with ENT.  Previously:    The patient's allergies, current medications, family history, past medical history, past social history, past surgical history and problem list were reviewed and updated as appropriate.   08/03/20: (She) reports a remote history of significant migraines in the 80s.  Her migraines eventually improved, she did not take prescription medicine in the past, was on Tylenol and over-the-counter Advil as needed, pursued biofeedback with good success at the time. In September 2021 she suddenly started having positional vertigo.  She woke up with symptoms at the time, she was on vacation at the time, she had to cut her vacation short.  She had associated nausea and vomiting, a milder tension  type of pressure-like headache in the bitemporal and bifrontal areas intermittently but different from her previous migraines which were severe and throbbing and often one-sided. She had no associated neurological accompaniments at the time.  I reviewed your office notes from 03/16/2020, as well as 09/14/2019.  She reported left ear pain and hearing loss.  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 She also reported episodic vertigo. 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  She had a brain MRI and internal auditory canal MRI with and  without contrast on 10/14/2019 and I reviewed the results: IMPRESSION: No cause for hearing loss identified. Negative for vestibular schwannoma. Mild chronic microvascular ischemic change in the white matter. No acute intracranial abnormality. She had another bout of vertigo in the past, in 2008 or 2009, incidentally, she was on vacation at the time and woke up with symptoms.  She does not recall having a headache at the time.   Overall, she has improved a little bit.  Her vertiginous symptoms have been much less, she has been to physical therapy and had a total of 3 visits, she had positional therapy and also was given exercises for home which have helped.  Sometimes she still has a low-grade pressure-like headache without vertiginous symptoms associated with it.  She takes Tylenol as needed for her headaches, not daily.  She does not take any Advil or nonsteroidal anti-inflammatory medications any longer because of her history of reflux disease, hiatal hernia and status post Nissen fundoplication.  She tries to hydrate well.  She has an updated eye exam and typically goes yearly.  Is due later this fall.  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  She does not always sleep very well.  She is known to snore.  She does not have telltale morning headaches but does not wake up fully rested at times.  She is married and lives with her husband, she is retired from [employer] in [employer].  She is a non-smoker and does not utilize alcohol on a regular basis and has typically no caffeine intake.   She sees [facility] for her eye exams. She has never had any sudden onset of one-sided weakness or numbness or tingling or droopy face or slurring of speech.  She has 2 grown children.  She also has grandchildren.  She has never had a sleep study.  She has nocturia typically once per average night. Jennifer Alvarez is a 70 year old right-handed woman with an underlying medical history of reflux disease,  hearing loss, atypical chest pain, migraine headaches, osteoporosis, seasonal allergies, thyroid disease, and vertigo, who reports a remote history of significant migraines in the 80s.  Her migraines eventually improved, she did not take prescription medicine in the past, was on Tylenol and over-the-counter Advil as needed, pursued biofeedback with good success at the time.   In September 2021 she suddenly started having positional vertigo.  She woke up with symptoms at the time, she was on vacation at the time, she had to cut her vacation short.  She had associated nausea and vomiting, a milder tension type of pressure-like headache in the bitemporal and bifrontal areas intermittently but different from her previous migraines which were severe and throbbing and often one-sided. She had no associated neurological accompaniments at the time.  I reviewed your office notes from 03/16/2020, as well as 09/14/2019.  She reported left ear pain and hearing loss.  It was felt to have ear pain  secondary to TMJ dysfunction. She also reported episodic vertigo. 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  She had a brain MRI and internal auditory canal MRI with and without contrast on 10/14/2019 and I reviewed the results: IMPRESSION: No cause for hearing loss identified. Negative for vestibular schwannoma. Mild chronic microvascular ischemic change in the white matter. No acute intracranial abnormality. She had another bout of vertigo in the past, in 2008 or 2009, incidentally, she was on vacation at the time and woke up with symptoms.  She does not recall having a headache at the time.   Overall, she has improved a little bit.  Her vertiginous symptoms have been much less, she has been to physical therapy and had a total of 3 visits, she had positional therapy and also was given exercises for home which have helped.  Sometimes she still has a low-grade pressure-like headache without vertiginous symptoms associated with  it.  She takes Tylenol as needed for her headaches, not daily.  She does not take any Advil or nonsteroidal anti-inflammatory medications any longer because of her history of reflux disease, hiatal hernia and status post Nissen fundoplication.  She tries to hydrate well.  She has an updated eye exam and typically goes yearly.  Is due later this fall.  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  She does not always sleep very well.  She is known to snore.  She does not have telltale morning headaches but does not wake up fully rested at times.  She is married and lives with her husband, she is retired from [employer] in [employer].  She is a non-smoker and does not utilize alcohol on a regular basis and has typically no caffeine intake.   She sees [facility] for her eye exams. She has never had any sudden onset of one-sided weakness or numbness or tingling or droopy face or slurring of speech.  She has 2 grown children.  She also has grandchildren.  She has never had a sleep study.  She has nocturia typically once per average night.  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   Vertigo     Her Past Surgical History Is Significant For: Past Surgical History:  Procedure Laterality Date   ABDOMINAL HYSTERECTOMY  2005   BLADDER REPAIR     BUNIONECTOMY     right foot   COLONOSCOPY     LAPAROSCOPIC NISSEN FUNDOPLICATION  0/16/0109   Procedure: LAPAROSCOPIC NISSEN  FUNDOPLICATION;  Surgeon: Pedro Earls, MD;  Location: [facility];  Service: General;  Laterality: N/A;  Redo Lap Nissen Fundoplication takedown of herniated nissen fundoplication redo hiatal hernia repair   NISSEN FUNDOPLICATION  3235   x2 5732, 2013   PELVIC LAPAROSCOPY     hysterectomy   [place]    Her Family History  Is Significant For: Family History  Problem Relation Age of Onset   Heart disease Father    Esophageal cancer Father 76       died age 19   Hypertension Mother        age 17   Anxiety disorder Mother    Depression Mother    Asthma Mother    COPD Mother    Stroke Mother    Anorexia nervosa Mother    Colon cancer Neg Hx    Rectal cancer Neg Hx    Stomach cancer Neg Hx    Pancreatic cancer Neg Hx    Colon polyps Neg Hx     Her Social History Is Significant For: Social History   Socioeconomic History   Marital status: Married    Spouse name: Jeneen Rinks   Number of children: 2   Years of education: Not on file   Highest education level: Bachelor's degree (e.g., BA, AB, BS)  Occupational History    Employer: [employer]    Comment: retired  Tobacco Use   Smoking status: Never   Smokeless tobacco: Never  Vaping Use   Vaping Use: Never used  Substance and Sexual Activity   Alcohol use: Never   Drug use: Never   Sexual activity: Not on file  Other Topics Concern   Not on file  Social History Narrative   Lives with husband   No caffeine    Social Determinants of Radio broadcast assistant Strain: Not on file  Food Insecurity: Not on file  Transportation Needs: Not on file  Physical Activity: Not on file  Stress: Not on file  Social Connections: Not on file    Her Allergies Are:  Allergies  Allergen Reactions   Bee Venom Anaphylaxis   Peanut-Containing Drug Products Anaphylaxis    Itching around mouth and throat   Other Swelling    Tree pollen Congestion, watery eyes, swelling to face Joaquin Courts   Vicodin  [Hydrocodone-Acetaminophen] Nausea And Vomiting   Celebrex [Celecoxib] Palpitations   Demerol Rash   Valium Rash  :   Her Current Medications Are:  Outpatient Encounter Medications as of 12/04/2020  Medication Sig   amLODipine (NORVASC) 5 MG tablet Take 1 tablet by mouth daily.   estradiol (VIVELLE-DOT) 0.025 MG/24HR Place 1 patch onto the skin 2 (two) times a week.   loratadine (CLARITIN) 10 MG tablet Take 10 mg by mouth daily.   metoCLOPramide (REGLAN) 5 MG tablet Take 1 tablet (5 mg total) by mouth at bedtime.   Multiple Vitamin (MULTI-VITAMIN) tablet Take 1 tablet by mouth daily.   Multiple Vitamins-Minerals (PRESERVISION AREDS 2 PO) Take 1 tablet by mouth daily.   omeprazole (PRILOSEC) 40 MG capsule Take 1 capsule (40 mg total) by mouth in the morning and at bedtime.   PRESCRIPTION MEDICATION Tree serum injection administered by nurse at the patient's office   sertraline (ZOLOFT) 25 MG tablet Take 25 mg by mouth daily.   [DISCONTINUED] aspirin EC 81 MG tablet Take 81 mg by mouth as needed.   No facility-administered encounter medications on file as of 12/04/2020.  :  Review of Systems:  Out of a complete 14 point review of systems, all are reviewed and negative with the exception of these symptoms as listed below:   Review of Systems  Neurological:        Pt stated her CPAP has been not working properly . Has had it looked at by company. Pt states she has been  having increasing headaches. Some vertigo at times . Pt states she has been sleeping better at night. [8/2 3:01 PM] Bell, Candace  Epworth Sleepiness Scale 0= would never doze 1= Alvarez chance of dozing 2= moderate chance of dozing 3= high chance of dozing Sitting and reading:2 Watching TV:2 Sitting inactive in a public place (ex. Theater or meeting):2 As a passenger in a car for an hour without a break:3 Lying down to rest in the afternoon:3 Sitting and talking to someone:1 Sitting quietly after lunch (no  alcohol):1 In a car, while stopped in traffic:0 Total: 14      Objective:  Neurological Exam  Physical Exam Physical Examination:   Vitals:   12/04/20 0726  BP: 136/77  Pulse: (!) 59    General Examination: The patient is a very pleasant 70 y.o. female in no acute distress. She appears well-developed and well-nourished and well groomed.   HEENT: Normocephalic, atraumatic, pupils are equal, round and reactive to light, tracking is well-preserved, no nystagmus, face is symmetric with normal facial animation.  Speech is clear without dysarthria, hypophonia or voice tremor, airway examination reveals mild mouth dryness, otherwise stable findings.  Hearing is grossly intact, bilateral hearing aids in place.   Chest: Clear to auscultation without wheezing, rhonchi or crackles noted.   Heart: S1+S2+0, regular and normal without murmurs, rubs or gallops noted.   Abdomen: Soft, non-tender and non-distended.   Extremities: There is no pitting edema.   Skin: Warm and dry without trophic changes noted.   Musculoskeletal: exam reveals no obvious joint deformities.   Neurologically: Mental status: The patient is awake, alert and oriented in all 4 spheres. Her immediate and remote memory, attention, language skills and fund of knowledge are appropriate. There is no evidence of aphasia, agnosia, apraxia or anomia. Speech is clear with normal prosody and enunciation. Thought process is linear. Mood is normal and affect is normal. Cranial nerves II - XII are as described above under HEENT exam.  Motor exam: Normal bulk, strength and tone is noted. There is no tremor. Fine motor skills and coordination: grossly intact. Cerebellar testing: No dysmetria or intention tremor. There is no truncal or gait ataxia. Sensory exam: intact to light touch. Gait, station and balance: She walks without difficulty.  Posture is age-appropriate.     Assessment and Plan:  In summary, Jennifer Alvarez is a very  pleasant 70 year old female with an underlying medical history of reflux disease, hearing loss, atypical chest pain, migraine headaches, osteoporosis, seasonal allergies, thyroid disease, and vertigo, who presents for follow-up consultation of her migraine headaches, history of vertigo and recent diagnosis of obstructive sleep apnea, which was deemed in the moderate range by home sleep testing in May 2022.  She has started AutoPap therapy.  She reports a modest improvement in her daytime energy and sleep quality.  She has not had a significant migraine in the recent past, has not used over-the-counter medication even.  She has not had a recent exacerbation of her intermittent vertigo.  All in all, she is doing fairly well.  We will await official download and usage data from her machine once she is able to bring her machine to her DME company.  We were not able to get data off her machine today.  She is advised to continue using her AutoPap.  We will update her once we have the compliance download for review.  She is advised to follow-up to see one of our nurse practitioners in about 6 months,  sooner if needed.  She was not able to fill the prescription for Roselyn Meier but is in need for any medication for migraine management at this time.  We can revisit as needed.  I answered all the questions today and the patient and her husband were in agreement. I spent 30 minutes in total face-to-face time and in reviewing records during pre-charting, more than 50% of which was spent in counseling and coordination of care, reviewing test results, reviewing medications and treatment regimen and/or in discussing or reviewing the diagnosis of OSA, migraines, the prognosis and treatment options. Pertinent laboratory and imaging test results that were available during this visit with the patient were reviewed by me and considered in my medical decision making (see chart for details).

## 2021-01-19 ENCOUNTER — Other Ambulatory Visit: Payer: Self-pay

## 2021-01-19 DIAGNOSIS — K219 Gastro-esophageal reflux disease without esophagitis: Secondary | ICD-10-CM

## 2021-01-19 MED ORDER — OMEPRAZOLE 40 MG PO CPDR: 40.0000 mg | DELAYED_RELEASE_CAPSULE | Freq: Two times a day (BID) | ORAL | 0 refills | Status: DC

## 2021-01-19 MED ORDER — METOCLOPRAMIDE HCL 5 MG PO TABS: 5.0000 mg | ORAL_TABLET | Freq: Every day | ORAL | 3 refills | Status: DC

## 2021-01-19 NOTE — Telephone Encounter (Signed)
Refill request via fax from [facility].

## 2021-02-08 ENCOUNTER — Ambulatory Visit: Payer: Medicare Other | Admitting: Neurology

## 2021-04-06 ENCOUNTER — Other Ambulatory Visit: Payer: Self-pay | Admitting: Gastroenterology

## 2021-05-30 ENCOUNTER — Telehealth: Payer: Self-pay | Admitting: *Deleted

## 2021-05-30 DIAGNOSIS — G4733 Obstructive sleep apnea (adult) (pediatric): Secondary | ICD-10-CM

## 2021-05-30 NOTE — Telephone Encounter (Signed)
New autoPAP order placed, as requested. May need compl FU within 3 mo. Please arrange with NP.

## 2021-05-30 NOTE — Telephone Encounter (Signed)
°   I'm checking to see if you can help me with one of Dr. Guadelupe Sabin patients in obtaining a new prescription for CPAP. Pt is Jennifer Alvarez DOB [birth year]. The patient was set up with an Ibreeze CPAP back on 09/29/2020. She is showing as noncompliant with her machine due to a download issue as we are unable to pull her data in the [employer] system. I did check her machine and was able to take visual pictures of the machine showing that she is compliant but it does not provide any actual dates. The patient's insurance also changed at the start of the new year so she and I have spoke and she would just like to start her therapy over with the new her new insurance. Also I will be providing her with a [facility] to ensure we have no issues with compliance.  Kennieth Rad RRT/RCP  Branch Manager   an [place], [place] Fax# [phone]

## 2021-05-30 NOTE — Telephone Encounter (Signed)
Order done, CM sent to [facility].

## 2021-06-04 ENCOUNTER — Encounter: Payer: Self-pay | Admitting: Adult Health

## 2021-06-04 ENCOUNTER — Ambulatory Visit: Payer: PPO | Admitting: Adult Health

## 2021-06-04 VITALS — BP 135/78 | HR 66 | Ht 59.0 in | Wt 134.2 lb

## 2021-06-04 DIAGNOSIS — G4733 Obstructive sleep apnea (adult) (pediatric): Secondary | ICD-10-CM | POA: Diagnosis not present

## 2021-06-04 DIAGNOSIS — Z9989 Dependence on other enabling machines and devices: Secondary | ICD-10-CM

## 2021-06-04 DIAGNOSIS — G43009 Migraine without aura, not intractable, without status migrainosus: Secondary | ICD-10-CM | POA: Diagnosis not present

## 2021-06-04 NOTE — Progress Notes (Signed)
PATIENT: Jennifer Alvarez [facility] DOB: 1951-01-28  REASON FOR VISIT: follow up HISTORY FROM: patient PRIMARY NEUROLOGIST: Dr. Rexene Alberts  HISTORY OF PRESENT ILLNESS: Today 06/04/21:  Jennifer Alvarez is a 70 year old female with a history of obstructive sleep apnea on CPAP and migraine headaches.  She returns today for follow-up.  We have not been able to get a download since she got the new machine.  She supposed be getting a different machine from her DME company.  She reports that since using the machine she is only had 2 migraine headaches.  Denies any new issues.  Returns today for an evaluation.   HISTORY (copied from Dr. Guadelupe Sabin note) 12/04/2020: I was not able to review her AutoPap data today as the serial number was not typed incorrectly and we were not tagged correctly.  Patient reports that she is using her machine nightly, she has had some difficulty adjusting, initially the machine was turned off spontaneously a few times and she had to take it back to the DME provider.  There was another occasion where the machine turned on spontaneously during the day.  She took the machine back.  She was told that she may need a replacement if she had ongoing issues with it.  She reports some modest improvement in her sleep consolidation and daytime energy.  She no longer snores audibly per husband.  She had to adjust the mask because of leaking.  The mouth is quite dry in the mornings although she has the humidity on a higher setting.  The humidifier should be worn but she reports that she only is feeling cool air.  She may need to get this looked at again by the DME provider.  As far as her migraines, she has not had a migraine attack, she could not fill the [place] due to cost.  She has not even had to use Tylenol, she has had an occasional frontal pressure-like headache but it does not last more than 10 or 15 minutes at a time.  She has had occasional positional dizziness which is brief, typically when she is standing  up too quickly but no significant vertigo-like attack.  She has been using bilateral hearing aids.  She has not had any new concerns.  She has not had a follow-up with ENT.  REVIEW OF SYSTEMS: Out of a complete 14 system review of symptoms, the patient complains only of the following symptoms, and all other reviewed systems are negative.    ALLERGIES: Allergies  Allergen Reactions   Bee Venom Anaphylaxis   Peanut-Containing Drug Products Anaphylaxis    Itching around mouth and throat   Other Swelling    Tree pollen Congestion, watery eyes, swelling to face [place] Courts   Vicodin [Hydrocodone-Acetaminophen] Nausea And Vomiting   Celebrex [Celecoxib] Palpitations   Demerol Rash   Valium Rash    HOME MEDICATIONS: Outpatient Medications Prior to Visit  Medication Sig Dispense Refill   amLODipine (NORVASC) 5 MG tablet Take 1 tablet by mouth daily.     estradiol (VIVELLE-DOT) 0.025 MG/24HR Place 1 patch onto the skin 2 (two) times a week.     loratadine (CLARITIN) 10 MG tablet Take 10 mg by mouth daily.     metoCLOPramide (REGLAN) 5 MG tablet Take 1 tablet (5 mg total) by mouth at bedtime. 30 tablet 3   Multiple Vitamin (MULTI-VITAMIN) tablet Take 1 tablet by mouth daily.     Multiple Vitamins-Minerals (PRESERVISION AREDS 2 PO) Take 1 tablet by mouth daily.  omeprazole (PRILOSEC) 40 MG capsule TAKE 1 CAPSULE BY MOUTH  TWICE DAILY IN THE MORNING  AND AT BEDTIME 180 capsule 1   PRESCRIPTION MEDICATION Tree serum injection administered by nurse at the patient's office     sertraline (ZOLOFT) 25 MG tablet Take 25 mg by mouth daily.     No facility-administered medications prior to visit.    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   Vertigo     PAST SURGICAL HISTORY: Past Surgical History:  Procedure Laterality Date   ABDOMINAL HYSTERECTOMY  2005   BLADDER REPAIR     BUNIONECTOMY     right foot   COLONOSCOPY     LAPAROSCOPIC NISSEN FUNDOPLICATION  5/85/2778   Procedure: LAPAROSCOPIC NISSEN FUNDOPLICATION;  Surgeon: Pedro Earls, MD;  Location: [facility];  Service: General;  Laterality: N/A;  Redo Lap Nissen Fundoplication takedown of herniated nissen fundoplication redo hiatal hernia repair   NISSEN FUNDOPLICATION  2423   x2 5361, 2013   PELVIC LAPAROSCOPY     hysterectomy   RHINOPLASTY  1980    FAMILY HISTORY: Family History  Problem Relation Age of Onset   Hypertension Mother        age 54   Anxiety disorder Mother    Depression Mother    Asthma Mother    COPD Mother    Stroke Mother    Anorexia nervosa Mother    Heart disease Father    Esophageal cancer Father 50       died age 65   Migraines Father    Colon cancer Neg Hx    Rectal cancer Neg Hx    Stomach cancer Neg Hx    Pancreatic cancer Neg Hx    Colon polyps Neg Hx     SOCIAL HISTORY: Social History   Socioeconomic History   Marital status: Married    Spouse name: Jennifer Alvarez   Number of children: 2   Years of education: Not on file   Highest education level: Bachelor's degree (e.g., BA, AB, BS)  Occupational History    Employer: [employer]    Comment: retired  Tobacco Use   Smoking status: Never   Smokeless tobacco: Never  Vaping Use   Vaping Use: Never used  Substance and Sexual Activity   Alcohol use: Never   Drug use: Never   Sexual activity: Not on file  Other Topics Concern   Not on file  Social History Narrative   Lives with husband   No caffeine    Social  Determinants of Health   Financial Resource Strain: Not on file  Food Insecurity: Not on file  Transportation Needs: Not on file  Physical Activity: Not on file  Stress: Not on file  Social Connections: Not on file  Intimate Partner Violence: Not on file      PHYSICAL EXAM  Vitals:  06/04/21 0817  BP: 135/78  Pulse: 66  Weight: 134 lb 3.2 oz (60.9 kg)  Height: 4\' 11"  (1.499 m)   Body mass index is 27.11 kg/m.  Generalized: Well developed, in no acute distress  Chest: Lungs clear to auscultation bilaterally  Neurological examination  Mentation: Alert oriented to time, place, history taking. Follows all commands speech and language fluent Cranial nerve II-XII: Extraocular movements were full, visual field were full on confrontational test Head turning and shoulder shrug  were normal and symmetric. Motor: The motor testing reveals 5 over 5 strength of all 4 extremities. Good symmetric motor tone is noted throughout.  Sensory: Sensory testing is intact to soft touch on all 4 extremities. No evidence of extinction is noted.  Gait and station: Gait is normal.    DIAGNOSTIC DATA (LABS, IMAGING, TESTING) - I reviewed patient records, labs, notes, testing and imaging myself where available.  Lab Results  Component Value Date   WBC 8.5 12/20/2011   HGB 10.8 (L) 12/20/2011   HCT 33.8 (L) 12/20/2011   MCV 81.6 12/20/2011   PLT 254 12/20/2011      Component Value Date/Time   NA 140 12/12/2011 1545   K 3.9 12/12/2011 1545   CL 102 12/12/2011 1545   CO2 29 12/12/2011 1545   GLUCOSE 107 (H) 12/12/2011 1545   BUN 9 06/17/2016 1631   CREATININE 0.82 06/17/2016 1631   CALCIUM 9.3 12/12/2011 1545   GFRNONAA 88 (L) 12/18/2011 1455   GFRAA >90 12/18/2011 1455     ASSESSMENT AND PLAN 71 y.o. year old female  has a past medical history of Allergy, Cataract, Complication of anesthesia, Fuchs' corneal dystrophy, Gastroparesis (08/19/00), GERD (gastroesophageal reflux disease),  Hearing loss, Hiatal hernia, Migraine, Osteoporosis, PONV (postoperative nausea and vomiting), Seasonal allergies, Sleep apnea with use of continuous positive airway pressure (CPAP), Thyroid disease, and Vertigo. here with:  OSA on CPAP  -Reports that she uses it nightly. -Unable to view a download - Encourage patient to use CPAP nightly and > 4 hours each night -Follow-up with DME company regarding new machine  2.  Migraine headaches  -Improvement since started on CPAP -Continue to monitor for now  Follow-up in 1 year or sooner if needed     Ward Givens, MSN, NP-C 06/04/2021, 8:25 AM [facility] [address] [place], [place] [address] [phone]

## 2021-06-04 NOTE — Telephone Encounter (Signed)
Denyse Amass, RN; Vanessa Ralphs got it      Previous Messages   ----- Message -----  From: Brandon Melnick, RN  Sent: 05/30/2021   1:09 PM EST  To: Vanessa Ralphs, Marchelle Gearing, *

## 2021-06-05 ENCOUNTER — Other Ambulatory Visit: Payer: Self-pay

## 2021-06-05 MED ORDER — OMEPRAZOLE 40 MG PO CPDR: 40.0000 mg | DELAYED_RELEASE_CAPSULE | Freq: Two times a day (BID) | ORAL | 2 refills | Status: DC

## 2021-06-05 NOTE — Progress Notes (Signed)
Called patient. She is not longer using [facility] since her insurance changed and requested a script sent to [facility] in [place] for her omeprazole.  Updated patient's pharmacy list and sent script to [facility].Informed patient that she will need an annual OV in May for further refills

## 2021-07-09 ENCOUNTER — Encounter: Payer: Self-pay | Admitting: Gastroenterology

## 2021-09-16 ENCOUNTER — Other Ambulatory Visit: Payer: Self-pay | Admitting: Gastroenterology

## 2021-10-02 ENCOUNTER — Encounter: Payer: Self-pay | Admitting: Gastroenterology

## 2021-10-02 ENCOUNTER — Ambulatory Visit: Payer: PPO | Admitting: Gastroenterology

## 2021-10-02 VITALS — BP 156/80 | HR 71 | Ht 59.0 in | Wt 128.4 lb

## 2021-10-02 DIAGNOSIS — M8580 Other specified disorders of bone density and structure, unspecified site: Secondary | ICD-10-CM

## 2021-10-02 DIAGNOSIS — K2270 Barrett's esophagus without dysplasia: Secondary | ICD-10-CM

## 2021-10-02 DIAGNOSIS — Z79899 Other long term (current) drug therapy: Secondary | ICD-10-CM

## 2021-10-02 DIAGNOSIS — R1013 Epigastric pain: Secondary | ICD-10-CM

## 2021-10-02 DIAGNOSIS — K219 Gastro-esophageal reflux disease without esophagitis: Secondary | ICD-10-CM

## 2021-10-02 DIAGNOSIS — K449 Diaphragmatic hernia without obstruction or gangrene: Secondary | ICD-10-CM | POA: Diagnosis not present

## 2021-10-02 DIAGNOSIS — K3184 Gastroparesis: Secondary | ICD-10-CM

## 2021-10-02 MED ORDER — OMEPRAZOLE 40 MG PO CPDR: 40.0000 mg | DELAYED_RELEASE_CAPSULE | Freq: Two times a day (BID) | ORAL | 5 refills | Status: DC

## 2021-10-02 MED ORDER — METOCLOPRAMIDE HCL 5 MG PO TABS: ORAL_TABLET | ORAL | 1 refills | Status: DC

## 2021-10-02 NOTE — Progress Notes (Signed)
HPI :  71 year old female here for follow-up visit for GERD and epigastric pain.  Recall she has a history of chronic reflux, had a Nissen fundoplication around 0175 or so.  She states she did quite well with the initial surgery but over time symptoms came back.  She had a Nissen fundoplication redo in 1025, which did not provide any benefit per her report.  She has been dealing with reflux symptoms since that time and has been on chronic PPI.  Currently taking omeprazole 40 mg twice daily.  Generally this has really minimized her symptoms and she does okay with this, however does take some Gaviscon for breakthrough heartburn and reflux about 2-3 times per month.  She does have some nocturnal symptoms that can bother her.  She has early satiety and has been eating less than usual.  Recall she has had a gastric emptying study in 2002 which showed some evidence of gastroparesis.  She also has some sporadic/intermittent epigastric discomfort.  There is no clear triggers for this, not necessarily related to eating although she is not sure.  It can occur once or twice a week, last 10 minutes or so and then resolves.  Pain does not radiate at all.  She denies dysphagia.  Recall her father had esophagus cancer. The symptoms have been ongoing for some time.  After her last endoscopy, we had discussed using some Reglan, specifically nightly to help some of her nocturnal symptoms in light of her reported history of gastroparesis.  She had never tried it.  Again she continues to have some early satiety at times, this was ongoing at the time of her last EGD.  Since our last visit she tried tapering her omeprazole and take 20 mg every morning and 40 every afternoon, had rebound reflux that bothered her, she did not tolerate it.  She had an EGD with me about a year ago showing a 4 cm hiatal hernia and short segment of Barrett's esophagus.  She has normal kidney function but does have a history of osteopenia.  She had a  negative right upper quadrant ultrasound in March 2021 for gallstones.  Last cross-sectional imaging was a CT scan in 2018.  She had a DEXA scan in March of last year at [facility] but I do not have the results of that on file that I can see.  Prior workup: EGD - July 2016 per Dr. Olevia Perches with finding of grade B distal esophagitis and a 4 cm hiatal hernia and functioning fundoplication.  Biopsies from the esophagus show changes consistent with GERD, no Barrett's.   Colonoscopy - February 2018 with multiple diverticuli in the left colon, 2 polyps were removed, one was a tubular adenoma, one was hyperplastic.   [place] 06/28/19 - IMPRESSION: GB normal no stones Probable fatty infiltration of liver as above. Otherwise negative exam.   CT abdomen / pelvis 07/29/2016: IMPRESSION: 1. A specific cause for chronic right lower quadrant pain is not identified. 2. Distal esophageal wall thickening (probably from esophagitis) with a small to moderate-sized hiatal hernia and what appears to be a partially un wrapped Nissen fundoplication also herniated as part of the hiatal hernia. 3. Left foraminal impingement at L5-S1 due to spurring. 4. Sigmoid diverticulosis without active diverticulitis. 5.  Prominent stool throughout the colon favors constipation.     EGD 09/20/20: - A 4 cm hiatal hernia was present. - The Z-line was irregular and was found 35 cm from the incisors, with a tongue of salmon colored  mucosa about 1cm in length. Biopsies were taken with a cold forceps for histology. - The examined esophagus was tortuous with an angulated turn entering the hernia sac. - The exam of the esophagus was otherwise normal. No stenosis / stricture appreciated. - Evidence of a Nissen fundoplication was found in the cardia. It was slightly loose. - The exam of the stomach was otherwise normal. - The duodenal bulb and second portion of the duodenum were normal.  Surgical [P], Irregular z line bx - BARRETT ESOPHAGUS -  NO DYSPLASIA OR MALIGNANCY IDENTIFIED    Past Medical History:  Diagnosis Date   Allergy    Cataract    minor   Complication of anesthesia    states chipped teeth with intubation   Fuchs' corneal dystrophy    Gastroparesis 08/19/2000   GERD (gastroesophageal reflux disease)    Hearing loss    hearing aids   Hiatal hernia    Migraine    migraines in past   Osteoporosis    PONV (postoperative nausea and vomiting)    Seasonal allergies    Sleep apnea    Resvent Machine uses distilled water. Said that sleep study her oxgen dropped to 80 in sleep   Sleep apnea with use of continuous positive airway pressure (CPAP)    Thyroid disease    35m tumor on thyroid   Vertigo      Past Surgical History:  Procedure Laterality Date   ABDOMINAL HYSTERECTOMY  2005   BLADDER REPAIR     BUNIONECTOMY     right foot   COLONOSCOPY     LAPAROSCOPIC NISSEN FUNDOPLICATION  86/22/2979  Procedure: LAPAROSCOPIC NISSEN FUNDOPLICATION;  Surgeon: MPedro Earls MD;  Location: [facility];  Service: General;  Laterality: N/A;  Redo Lap Nissen Fundoplication takedown of herniated nissen fundoplication redo hiatal hernia repair   NISSEN FUNDOPLICATION  28921  x2 [id number] 2013   PELVIC LAPAROSCOPY     hysterectomy   RHINOPLASTY  1980   Family History  Problem Relation Age of Onset   Hypertension Mother        age 430  Anxiety disorder Mother    Depression Mother    Asthma Mother    COPD Mother    Stroke Mother    Anorexia nervosa Mother    Heart disease Father    Esophageal cancer Father 866      died age 466  Migraines Father    Colon cancer Neg Hx    Rectal cancer Neg Hx    Stomach cancer Neg Hx    Pancreatic cancer Neg Hx    Colon polyps Neg Hx    Social History   Tobacco Use   Smoking status: Never   Smokeless tobacco: Never  Vaping Use   Vaping Use: Never used  Substance Use Topics   Alcohol use: Never   Drug use: Never   Current Outpatient Medications  Medication Sig  Dispense Refill   amLODipine (NORVASC) 5 MG tablet Take 1 tablet by mouth daily.     estradiol (VIVELLE-DOT) 0.025 MG/24HR Place 1 patch onto the skin 2 (two) times a week.     fluticasone (FLONASE) 50 MCG/ACT nasal spray 2 sprays daily.     loratadine (CLARITIN) 10 MG tablet Take 10 mg by mouth daily.     Multiple Vitamin (MULTI-VITAMIN) tablet Take 1 tablet by mouth daily.     Multiple Vitamins-Minerals (PRESERVISION AREDS 2 PO) Take 1 tablet by mouth daily.  omeprazole (PRILOSEC) 40 MG capsule Take 1 capsule (40 mg total) by mouth 2 (two) times daily. Please schedule a yearly office visit for further refills. Thank you 60 capsule 1   PRESCRIPTION MEDICATION Tree serum injection administered by nurse at the patient's office     sertraline (ZOLOFT) 25 MG tablet Take 25 mg by mouth daily.     metoCLOPramide (REGLAN) 5 MG tablet Take 1 tablet (5 mg total) by mouth at bedtime. (Patient not taking: Reported on 10/02/2021) 30 tablet 3   No current facility-administered medications for this visit.   Allergies  Allergen Reactions   Bee Venom Anaphylaxis   Peanut-Containing Drug Products Anaphylaxis    Itching around mouth and throat   Other Swelling    Tree pollen Congestion, watery eyes, swelling to face /eyes   Vicodin [Hydrocodone-Acetaminophen] Nausea And Vomiting   Celebrex [Celecoxib] Palpitations   Demerol Rash   Valium Rash     Review of Systems: All systems reviewed and negative except where noted in HPI.   Labs reviewed in careeverywhere   No results found for: ALT, AST, GGT, ALKPHOS, BILITOT   Physical Exam: BP (!) 156/80   Pulse 71   Ht '4\' 11"'$  (1.499 m)   Wt 128 lb 6 oz (58.2 kg)   BMI 25.93 kg/m  Constitutional: Pleasant,well-developed, female in no acute distress. Neurological: Alert and oriented to person place and time. Psychiatric: Normal mood and affect. Behavior is normal.   ASSESSMENT AND PLAN: 71 year old female here for reassessment of the  following:  GERD Barrett's esophagus Hiatal hernia Long-term PPI use Gastroparesis Epigastric pain Osteopenia  History as outlined above.  She has had 2 reflux surgeries and failed, has required high-dose PPI to control symptoms.  She has developed a short segment of Barrett's esophagus over the years.  We discussed long-term plan.  She has tried to wean off PPI but has had recurrent symptoms that really bothered her.  In this light she requires high-dose PPI to control symptoms, will continue '40mg'$  BID of omeprazole, we did discuss long-term risks of PPI use to include CKD and increased risk for bone fracture.  For her, benefits are outweighed by the risks and she understands this. She does have osteopenia and that is being managed by her primary care.  She understands she is at increased risk for bone fracture on this regimen.  She can use Gaviscon as needed for breakthrough.  She likely does have some component of underlying gastroparesis based on remote GES and her symptoms.  I have discussed Reglan use with her, she has been hesitant to use in the past but if she has symptoms I think safe to try low-dose and use as needed.  She can take 5 mg 3 times daily as needed either prior to meals or prior to bedtime when she has nocturnal symptoms.  Discussed potential associated risks which I think would be quite low at low-dose.  Unclear if her epigastric pain is related to gastroparesis or not.  Offered her cross-sectional imaging to further evaluate this if it bothers her, she wants to see how she does on Reglan and if symptoms persist she will let me know may consider it.  Prior ultrasound showed no evidence of gallstones and her symptoms are not typical for biliary colic.  We will plan a repeat EGD in 2025 for Barrett's surveillance.  She will follow-up with me in 1 year for reassessment otherwise.  Jolly Mango, MD [facility]

## 2021-10-02 NOTE — Patient Instructions (Addendum)
If you are age 71 or older, your body mass index should be between 23-30. Your Body mass index is 25.93 kg/m. If this is out of the aforementioned range listed, please consider follow up with your Primary Care Provider.  If you are age 67 or younger, your body mass index should be between 19-25. Your Body mass index is 25.93 kg/m. If this is out of the aformentioned range listed, please consider follow up with your Primary Care Provider.   ________________________________________________________  The  GI providers would like to encourage you to use [facility] to communicate with providers for non-urgent requests or questions.  Due to long hold times on the telephone, sending your provider a message by [facility] may be a faster and more efficient way to get a response.  Please allow 48 business hours for a response.  Please remember that this is for non-urgent requests.  _______________________________________________________  Take Reglan 5 mg three times a day with meals or daily at bedtime as needed  Continue omeprazole 40 mg twice a day.  You will be due for an EGD in 2025. We will send you a reminder in the mail when it gets closer to that time.  Thank you for entrusting me with your care and for choosing [facility], Dr. [place] Cellar

## 2022-01-29 ENCOUNTER — Ambulatory Visit: Payer: PPO | Admitting: Neurology

## 2022-01-29 ENCOUNTER — Telehealth: Payer: Self-pay | Admitting: Neurology

## 2022-01-29 ENCOUNTER — Encounter: Payer: Self-pay | Admitting: Neurology

## 2022-01-29 VITALS — Ht 59.0 in | Wt 130.4 lb

## 2022-01-29 DIAGNOSIS — G4733 Obstructive sleep apnea (adult) (pediatric): Secondary | ICD-10-CM | POA: Diagnosis not present

## 2022-01-29 DIAGNOSIS — G43809 Other migraine, not intractable, without status migrainosus: Secondary | ICD-10-CM | POA: Diagnosis not present

## 2022-01-29 DIAGNOSIS — H8190 Unspecified disorder of vestibular function, unspecified ear: Secondary | ICD-10-CM | POA: Diagnosis not present

## 2022-01-29 NOTE — Telephone Encounter (Signed)
PT referral sent to [facility], phone # [phone]. Emailed to [email or web address]

## 2022-01-29 NOTE — Progress Notes (Signed)
Subjective:    Patient ID: Jennifer Alvarez is a 71 y.o. female.  HPI    Interim history:  Jennifer Alvarez is a 71 year old right-handed woman with an underlying medical history of reflux disease, hearing loss, atypical chest pain, migraine headaches, osteoporosis, seasonal allergies, thyroid disease, and vertigo, who presents for follow-up consultation of her migraines and sleep apnea.  The patient is accompanied by her husband today. I last saw her on 12/04/2020, at which time a download was not possible from her AutoPap machine.  She reported compliance with it.  She reported improvement in her sleep consolidation and daytime energy.  She reported not feeling the Ubrelvy due to cost.  She had less migraines as well.  She did not have a follow-up appointment with ENT, denied any recent vertigo episodes.  She saw Ward Givens, NP on 06/04/2021 in follow-up, at which time she reported improvement in her migraines since starting AutoPap therapy.  She was advised to follow-up routinely in 1 year.  Today, 01/29/2022: She requested a sooner appointment due to a recent episode of vertigo, it was similar to what she had before.  She had sudden onset of spinning sensation, no vomiting, no one-sided weakness or numbness or tingling, symptoms lasted for about 24 hours.  She did have a dull headache, nothing like her previous migraines, she takes Tylenol as needed, she does not really have any severe migraines any longer.  She is faithful with her AutoPap machine.  I was able to review her compliance data from 12/27/2021 through 01/25/2022, which is a total of 30 days, during which time she used her machine every night with percent use days greater than 4 hours at 93%, indicating excellent compliance with an average usage of 7 hours and 11 minutes, residual AHI at goal at 4/h, pressure for the 95th percentile at 10.4 cm with a range of 5 to 11 cm with EPR of 2.  Leak on the higher side with the 95th percentile at 32 L/min.   She has adjusted well to treatment, she is pleased with her AutoPap machine, she had to change machines due to a defect in her previous machine.  She still indicates good benefit from treatment.  She has not seen ENT recently.  She had physical therapy through [facility] therapy in the past, she still has some of the exercises that they gave her, she tried to do those exercises but it did not seem to help.  She is agreeable to a referral to physical therapy.  No obvious trigger for this last vertigo attack, had a normal day, tries to keep well-hydrated on a day-to-day basis.  Previously:    I first met her on 08/03/2020 at the request of Dr. Constance Holster in the ENT for concern for vertiginous migraines.  She reported positional vertigo since September 2021 and a longstanding history of migraines going back to the 80s.  Her migraines eventually improved.  She also had noted improvement of her vertigo with physical therapy.  She had a recent brain MRI.  She was advised to use Ubrelvy as needed for migraine attacks.  She was advised to proceed with a sleep study to rule out sleep apnea as a cause for headaches.   She had a home sleep test on 08/28/2020 which indicated moderate obstructive sleep apnea with an AHI of 15/h, O2 nadir of 80%.  She was advised to start AutoPap therapy.       08/03/20: (She) reports a remote history of significant  migraines in the 48s.  Her migraines eventually improved, she did not take prescription medicine in the past, was on Tylenol and over-the-counter Advil as needed, pursued biofeedback with good success at the time. In September 2021 she suddenly started having positional vertigo.  She woke up with symptoms at the time, she was on vacation at the time, she had to cut her vacation short.  She had associated nausea and vomiting, a milder tension type of pressure-like headache in the bitemporal and bifrontal areas intermittently but different from her previous migraines which were  severe and throbbing and often one-sided. She had no associated neurological accompaniments at the time.  I reviewed your office notes from 03/16/2020, as well as 09/14/2019.  She reported left ear pain and hearing loss.  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 She also reported episodic vertigo. 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  She had a brain MRI and internal auditory canal MRI with and without contrast on 10/14/2019 and I reviewed the results: IMPRESSION: No cause for hearing loss identified. Negative for vestibular schwannoma. Mild chronic microvascular ischemic change in the white matter. No acute intracranial abnormality. She had another bout of vertigo in the past, in 2008 or 2009, incidentally, she was on vacation at the time and woke up with symptoms.  She does not recall having a headache at the time.   Overall, she has improved a little bit.  Her vertiginous symptoms have been much less, she has been to physical therapy and had a total of 3 visits, she had positional therapy and also was given exercises for home which have helped.  Sometimes she still has a low-grade pressure-like headache without vertiginous symptoms associated with it.  She takes Tylenol as needed for her headaches, not daily.  She does not take any Advil or nonsteroidal anti-inflammatory medications any longer because of her history of reflux disease, hiatal hernia and status post Nissen fundoplication.  She tries to hydrate well.  She has an updated eye exam and typically goes yearly.  Is due later this fall.  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  She does not always sleep very well.  She is known to snore.  She does not have telltale morning headaches but does not wake up fully rested at times.  She is married and lives with her husband, she is retired from [employer] in [employer].  She is a non-smoker and does not utilize alcohol on a regular basis and has typically no caffeine  intake.   She sees [facility] for her eye exams. She has never had any sudden onset of one-sided weakness or numbness or tingling or droopy face or slurring of speech.  She has 2 grown children.  She also has grandchildren.  She has never had a sleep study.  She has nocturia typically once per average night. Jennifer Alvarez is a 71 year old right-handed woman with an underlying medical history of reflux disease, hearing loss, atypical chest pain, migraine headaches, osteoporosis, seasonal allergies, thyroid disease, and vertigo, who reports a remote history of significant migraines in the 80s.  Her migraines eventually improved, she did not take prescription medicine in the past, was on Tylenol and over-the-counter Advil as needed, pursued biofeedback with good success at the time.   In September 2021 she suddenly started having positional vertigo.  She woke up with symptoms at the time, she was on vacation at the time, she had to cut  her vacation short.  She had associated nausea and vomiting, a milder tension type of pressure-like headache in the bitemporal and bifrontal areas intermittently but different from her previous migraines which were severe and throbbing and often one-sided. She had no associated neurological accompaniments at the time.  I reviewed your office notes from 03/16/2020, as well as 09/14/2019.  She reported left ear pain and hearing loss.  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 She also reported episodic vertigo. 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  She had a brain MRI and internal auditory canal MRI with and without contrast on 10/14/2019 and I reviewed the results: IMPRESSION: No cause for hearing loss identified. Negative for vestibular schwannoma. Mild chronic microvascular ischemic change in the white matter. No acute intracranial abnormality. She had another bout of vertigo in the past, in 2008 or 2009, incidentally, she was on vacation at the time and  woke up with symptoms.  She does not recall having a headache at the time.     Her Past Medical History Is Significant For: Past Medical History:  Diagnosis Date   Allergy    Cataract    minor   Complication of anesthesia    states chipped teeth with intubation   Fuchs' corneal dystrophy    Gastroparesis 08/19/2000   GERD (gastroesophageal reflux disease)    Hearing loss    hearing aids   Hiatal hernia    Migraine    migraines in past   Osteoporosis    PONV (postoperative nausea and vomiting)    Seasonal allergies    Sleep apnea    Resvent Machine uses distilled water. 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   Vertigo     Her Past Surgical History Is Significant For: Past Surgical History:  Procedure Laterality Date   ABDOMINAL HYSTERECTOMY  2005   BLADDER REPAIR     BUNIONECTOMY     right foot   COLONOSCOPY     LAPAROSCOPIC NISSEN FUNDOPLICATION  3/83/3383   Procedure: LAPAROSCOPIC NISSEN FUNDOPLICATION;  Surgeon: Pedro Earls, MD;  Location: [facility];  Service: General;  Laterality: N/A;  Redo Lap Nissen Fundoplication takedown of herniated nissen fundoplication redo hiatal hernia repair   NISSEN FUNDOPLICATION  2919   x2 1660, 2013   PELVIC LAPAROSCOPY     hysterectomy   [place]    Her Family History Is Significant For: Family History  Problem Relation Age of Onset   Hypertension Mother        age 86   Anxiety disorder Mother    Depression Mother    Asthma Mother    COPD Mother    Stroke Mother    Anorexia nervosa Mother    Heart disease Father    Esophageal cancer Father 39       died age 37   Migraines Father    Colon cancer Neg Hx    Rectal cancer Neg Hx    Stomach cancer Neg Hx    Pancreatic cancer Neg Hx    Colon polyps Neg Hx     Her Social History Is Significant For: Social History   Socioeconomic  History   Marital status: Married    Spouse name: Jeneen Rinks   Number of children: 2   Years of education: Not on file  Highest education level: Bachelor's degree (e.g., BA, AB, BS)  Occupational History    Employer: [employer]    Comment: retired  Tobacco Use   Smoking status: Never   Smokeless tobacco: Never  Vaping Use   Vaping Use: Never used  Substance and Sexual Activity   Alcohol use: Never   Drug use: Never   Sexual activity: Not on file  Other Topics Concern   Not on file  Social History Narrative   Lives with husband   No caffeine    Social Determinants of Radio broadcast assistant Strain: Not on file  Food Insecurity: Not on file  Transportation Needs: Not on file  Physical Activity: Not on file  Stress: Not on file  Social Connections: Not on file    Her Allergies Are:  Allergies  Allergen Reactions   Bee Venom Anaphylaxis   Peanut-Containing Drug Products Anaphylaxis    Itching around mouth and throat   Other Swelling    Tree pollen Congestion, watery eyes, swelling to face Joaquin Courts   Vicodin [Hydrocodone-Acetaminophen] Nausea And Vomiting   Celebrex [Celecoxib] Palpitations   Demerol Rash   Valium Rash  :   Her Current Medications Are:  Outpatient Encounter Medications as of 01/29/2022  Medication Sig   amLODipine (NORVASC) 5 MG tablet Take 1 tablet by mouth daily.   estradiol (VIVELLE-DOT) 0.025 MG/24HR Place 1 patch onto the skin 2 (two) times a week.   fluticasone (FLONASE) 50 MCG/ACT nasal spray 2 sprays daily.   loratadine (CLARITIN) 10 MG tablet Take 10 mg by mouth daily.   metoCLOPramide (REGLAN) 5 MG tablet Take three times a day with meals or daily at bedtime as needed (Patient taking differently: daily at 2 PM.)   Multiple Vitamin (MULTI-VITAMIN) tablet Take 1 tablet by mouth daily.   Multiple Vitamins-Minerals (PRESERVISION AREDS 2 PO) Take 1 tablet by mouth daily.   omeprazole (PRILOSEC) 40 MG capsule Take 1 capsule (40 mg  total) by mouth 2 (two) times daily.   PRESCRIPTION MEDICATION Tree serum injection administered by nurse at the patient's office   sertraline (ZOLOFT) 25 MG tablet Take 25 mg by mouth daily.   No facility-administered encounter medications on file as of 01/29/2022.  :  Review of Systems:  Out of a complete 14 point review of systems, all are reviewed and negative with the exception of these symptoms as listed below:       Review of Systems  Neurological:        Pt states dizziness started  this past Sunday . Pt states she couldn't move and very dizzy . Pt states she couldn't go anywhere all Sunday Pt states Monday she had brain fog  Pt states did have Covid booster last Wednesday     Objective:  Neurological Exam  Physical Exam Physical Examination:   On orthostatic testing: Lying blood pressure 143/77 with a pulse of 66, sitting 138/80 with a pulse of 71, standing 139/75 with a pulse of 73.  No orthostatic lightheadedness, no vertiginous symptoms upon sudden changes in head position.  General Examination: The patient is a very pleasant 71 y.o. female in no acute distress. She appears well-developed and well-nourished and well groomed.   HEENT: Normocephalic, atraumatic, pupils are equal, round and reactive to light, corrective eyeglasses in place.  Tracking is well-preserved, no nystagmus, face is symmetric with normal facial animation.  Speech is clear without dysarthria, hypophonia or voice tremor, airway examination reveals mild mouth dryness, otherwise stable  findings.  Hearing is grossly intact, bilateral hearing aids in place.   Chest: Clear to auscultation without wheezing, rhonchi or crackles noted.   Heart: S1+S2+0, regular and normal without murmurs, rubs or gallops noted.   Abdomen: Soft, non-tender and non-distended.   Extremities: There is no pitting edema.   Skin: Warm and dry without trophic changes noted.   Musculoskeletal: exam reveals no obvious joint  deformities.   Neurologically: Mental status: The patient is awake, alert and oriented in all 4 spheres. Her immediate and remote memory, attention, language skills and fund of knowledge are appropriate. There is no evidence of aphasia, agnosia, apraxia or anomia. Speech is clear with normal prosody and enunciation. Thought process is linear. Mood is normal and affect is normal. Cranial nerves II - XII are as described above under HEENT exam.  Motor exam: Normal bulk, strength and tone is noted. There is no obvious tremor.  Reflexes are 1-2+ throughout including ankles.  Fine motor skills and coordination: grossly intact. Cerebellar testing: No dysmetria or intention tremor. There is no truncal or gait ataxia. Sensory exam: intact to light touch. Gait, station and balance: She walks without difficulty.  No veering to one side, no walking aid.  Posture is age-appropriate.     Assessment and Plan:  In summary, Jennifer Alvarez is a very pleasant 71 year old female with an underlying medical history of reflux disease, hearing loss, atypical chest pain, migraine headaches, osteoporosis, seasonal allergies, thyroid disease, and vertigo, who presents for follow-up consultation of her episodic vertigo, history of migraine headaches, history of sleep apnea.  She requested a sooner appointment because of her recent vertigo attack, it was accompanied by a dull headache.  No new symptoms, no focal findings on exam and symptoms have essentially resolved.  She is advised to pursue physical therapy for vestibular rehab again, I made a referral to [facility] physical therapy as per her request.  She has been there before.  She is encouraged to follow-up with ENT.  She is commended on her treatment adherence with her AutoPap, she is compliant with treatment and benefits from it.  She has moderate OSA as seen on home sleep testing in May 2022.  She has a benign exam, no orthostatic hypotension today.  She is advised to  follow-up routinely to see Ward Givens, nurse practitioner in 1 year.  I answered all her questions today and she was in agreement.   I spent 30 minutes in total face-to-face time and in reviewing records during pre-charting, more than 50% of which was spent in counseling and coordination of care, reviewing test results, reviewing medications and treatment regimen and/or in discussing or reviewing the diagnosis of vertigo, OSA, migraine headaches, the prognosis and treatment options. Pertinent laboratory and imaging test results that were available during this visit with the patient were reviewed by me and considered in my medical decision making (see chart for details).

## 2022-01-29 NOTE — Patient Instructions (Signed)
It was nice to see you again today.  You are fully compliant with your AutoPap, keep up the good work!  For your episodic vertigo, I will make another referral to physical therapy, sometimes it helps to refresh these exercises.  Please continue to hydrate well with water and use Tylenol as needed for headaches.  Consider a follow-up appointment with your ENT, Dr. Constance Holster as well.  If you have any new symptoms such as sudden onset of one-sided weakness or numbness or tingling, droopy face or slurring of speech or a prolonged vertigo more than your usual, please call 911 or go to the emergency room with someone taking you. We can see you in 1 year, you can see Jinny Blossom next time routinely.

## 2022-03-23 ENCOUNTER — Other Ambulatory Visit: Payer: Self-pay | Admitting: Gastroenterology

## 2022-05-14 IMAGING — MR MR BRAIN/IAC WO/W CM
11 of 12 series · 38 of 48 positions shown · IV contrast (multihance)
Comparison: None.

CLINICAL DATA: Asymmetric hearing loss left ear.

EXAM:
MRI HEAD WITHOUT AND WITH CONTRAST
TECHNIQUE: Multiplanar, multiecho pulse sequences of the brain and surrounding
structures were obtained without and with intravenous contrast.
CONTRAST:  12mL MULTIHANCE GADOBENATE DIMEGLUMINE 529 MG/ML IV SOLN

[Series 2: T1 · sagittal · 5.0mm · 0.45mm/px · 2 of 21 slices shown (1 of 4)]
[im 1/21]
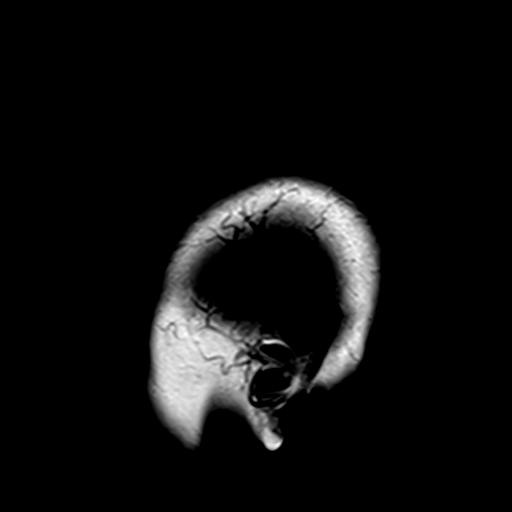
[im 21/21]
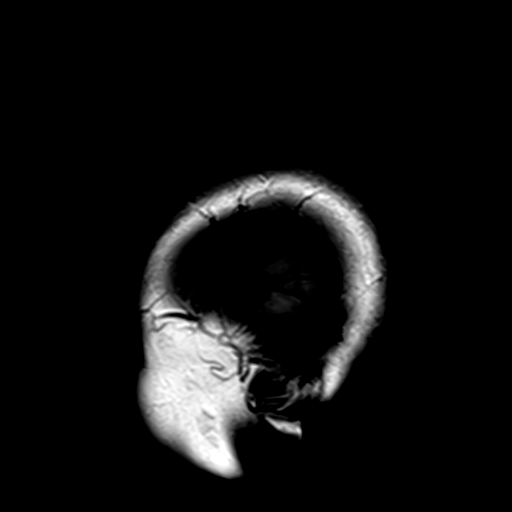

[Series 3: ep2d_diff_3 · axial · 3.0mm · 1.80mm/px · z∈[-49,+90]mm · 8 of 95 slices shown]
[im 1/95]
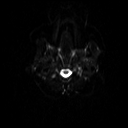
[im 11/95]
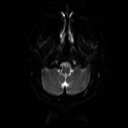
[im 32/95]
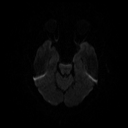
[im 42/95]
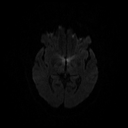
[im 53/95]
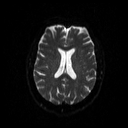
[im 63/95]
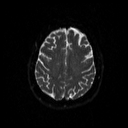
[im 84/95]
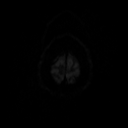
[im 95/95]
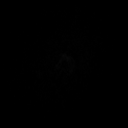

[Series 4: ep2d_diff_3_adc · axial · 3.0mm · 1.80mm/px · z∈[-49,+90]mm · 5 of 48 slices shown]
[im 1/48]
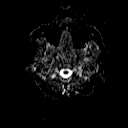
[im 12/48]
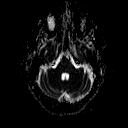
[im 24/48]
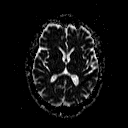
[im 36/48]
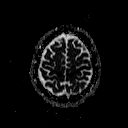
[im 48/48]
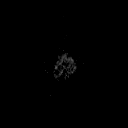

[Series 5: t2_tse_tra_512 · axial · 5.0mm · 0.60mm/px · z∈[-49,+93]mm · 3 of 24 slices shown]
[im 1/24]
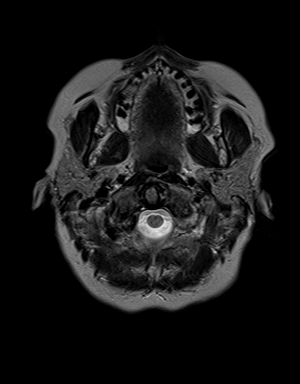
[im 12/24]
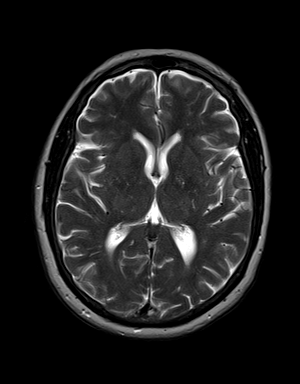
[im 24/24]
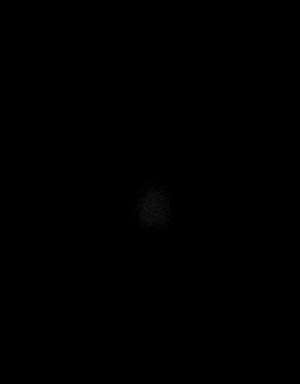

[Series 7: swi_images · axial · 2.0mm · 0.90mm/px · z∈[-48,+92]mm · 8 of 72 slices shown]
[im 1/72]
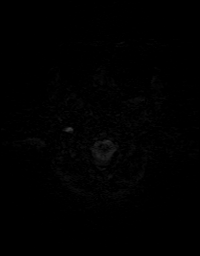
[im 11/72]
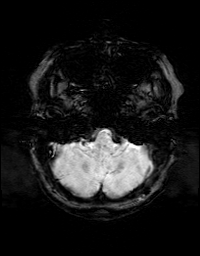
[im 21/72]
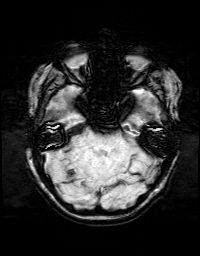
[im 31/72]
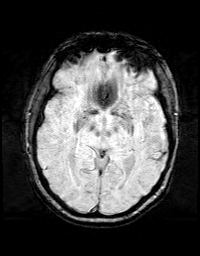
[im 41/72]
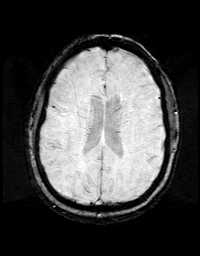
[im 51/72]
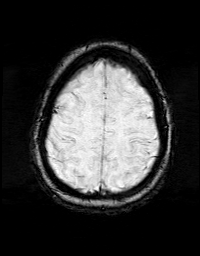
[im 61/72]
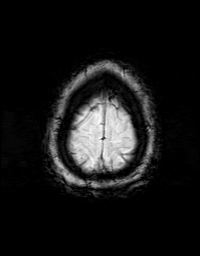
[im 72/72]
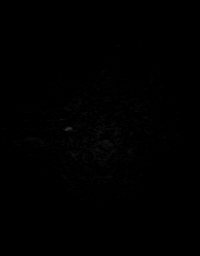

[Series 8: FLAIR · axial · 3.0mm · 0.43mm/px · z∈[-50,+93]mm · 3 of 25 slices shown]
[im 1/25]
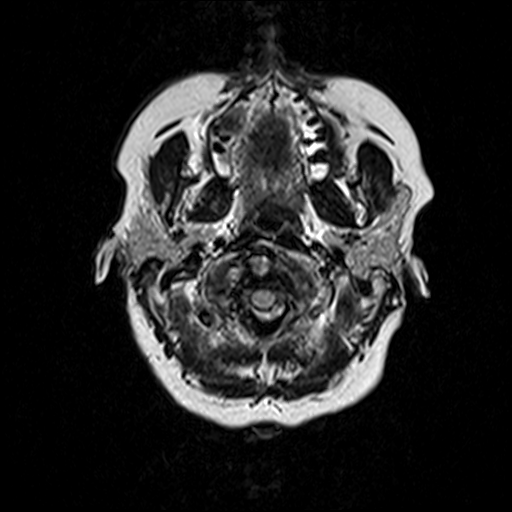
[im 13/25]
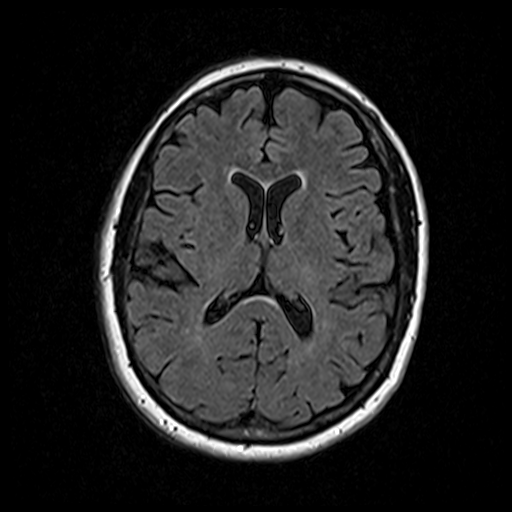
[im 25/25]
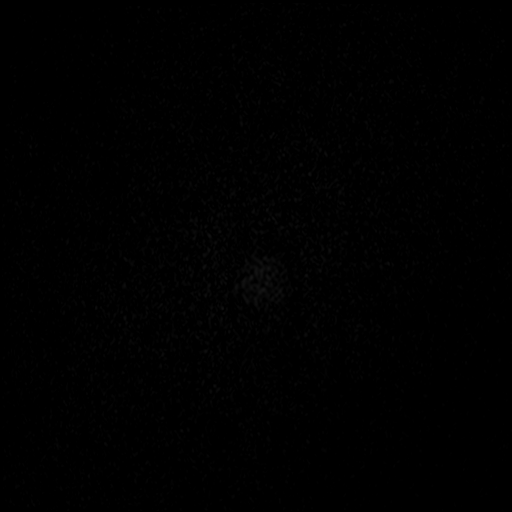

[Series 9: T1 · coronal · 3.0mm · 0.35mm/px · 1 of 11 slices shown (2 of 4)]
[im 1/11]
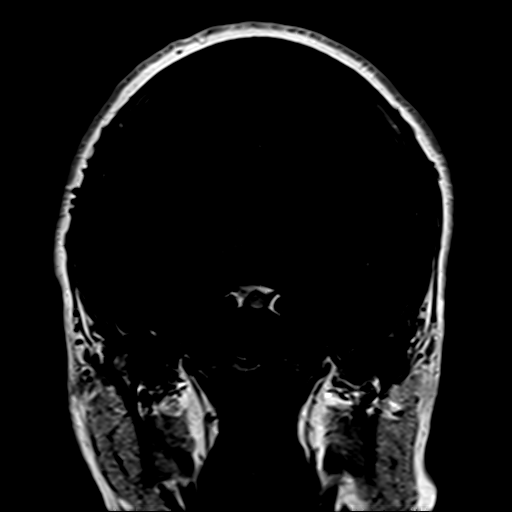

[Series 10: T1 · axial · 3.0mm · 0.35mm/px · 1 of 11 slices shown (3 of 4)]
[im 1/11]
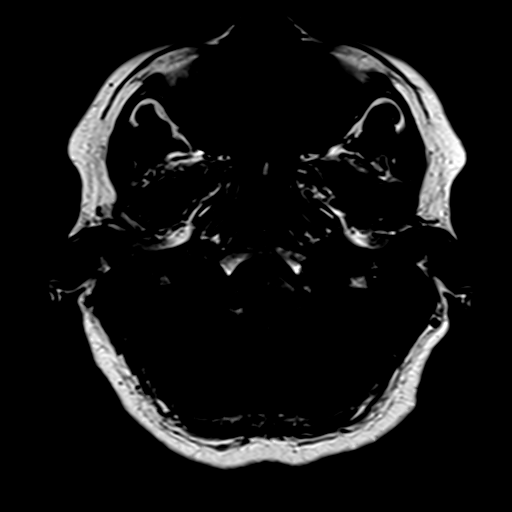

[Series 11: bSSFP · axial · 0.7mm · 0.28mm/px · z∈[-21,+9]mm · 5 of 44 slices shown]
[im 1/44]
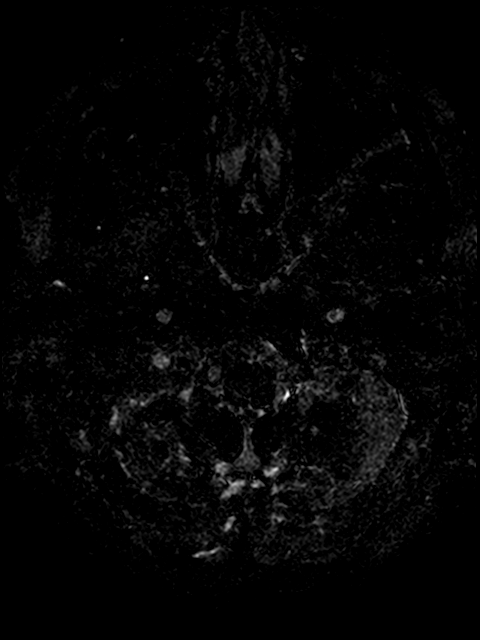
[im 11/44]
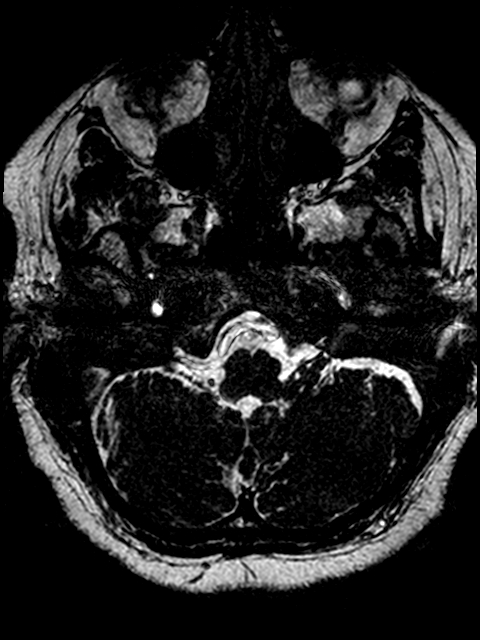
[im 22/44]
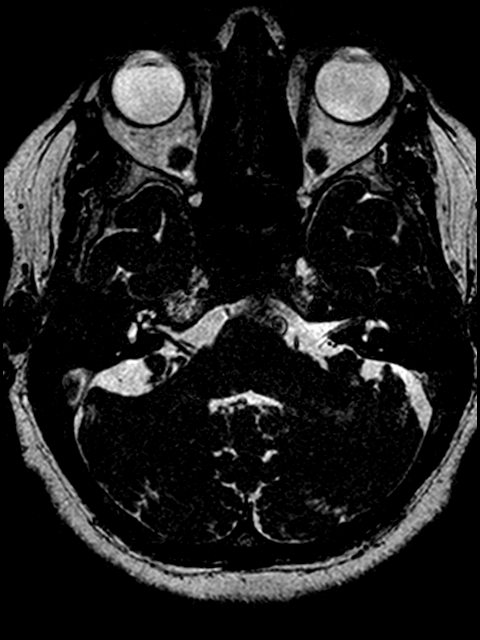
[im 33/44]
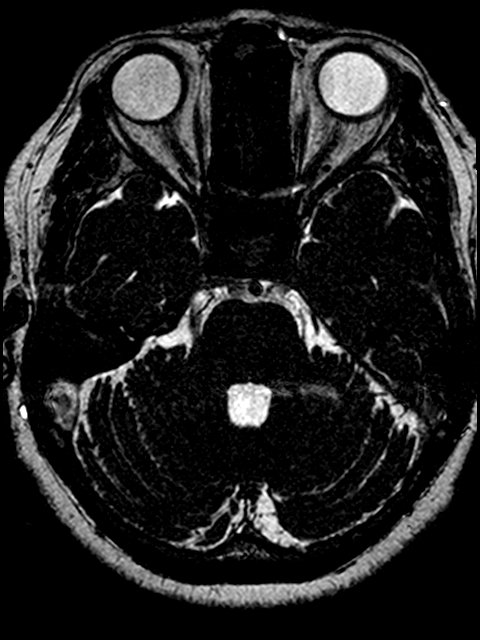
[im 44/44]
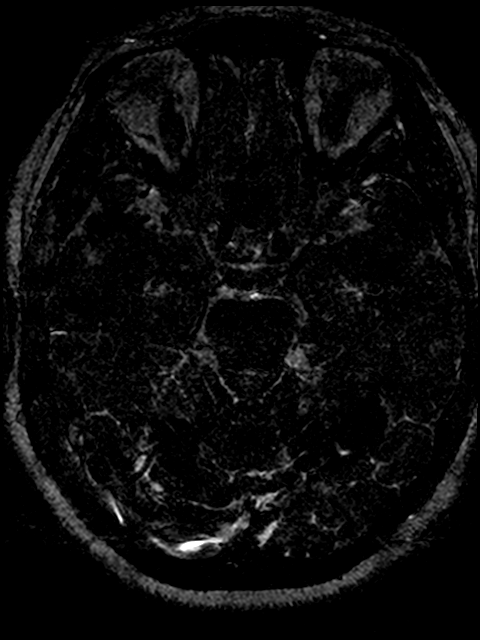

[Series 12: T1 · coronal · 3.0mm · 0.35mm/px · 1 of 11 slices shown (4 of 4)]
[im 1/11]
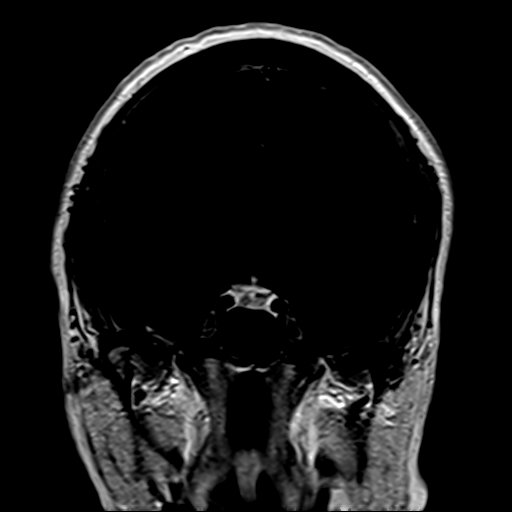

[Series 13: T1 post-contrast · axial · 3.0mm · 0.35mm/px · 1 of 11 slices shown]
[im 1/11]
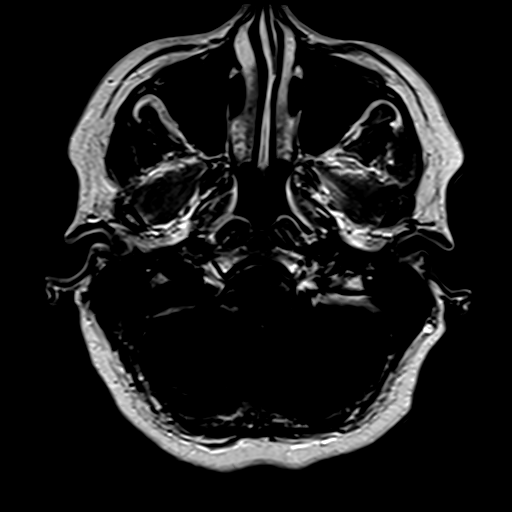

[38 of 48 positions shown; findings below may reference images not displayed]

FINDINGS: Brain: IAC protocol. Seventh and eighth cranial nerves normal.
Negative for vestibular schwannoma. Basilar cisterns normal.
Brainstem and cerebellum normal. Mastoid sinus clear bilaterally.
Normal enhancement in the temporal bone bilaterally.

Ventricle size normal. Negative for acute infarct, hemorrhage, mass.
Scattered small white matter hyperintensities bilaterally consistent
with mild chronic microvascular ischemia.

Vascular: Normal arterial flow voids

Skull and upper cervical spine: Normal bone marrow.

Sinuses/Orbits: Mild mucosal edema paranasal sinuses. Bilateral
cataract extraction

Other: None
IMPRESSION: No cause for hearing loss identified. Negative for vestibular
schwannoma.

Mild chronic microvascular ischemic change in the white matter. No
acute intracranial abnormality.

## 2022-06-06 ENCOUNTER — Ambulatory Visit: Payer: PPO | Admitting: Adult Health

## 2022-10-10 ENCOUNTER — Telehealth: Payer: Self-pay | Admitting: Neurology

## 2022-10-10 NOTE — Telephone Encounter (Signed)
Usually the DME company speaks with insurance. I have sent a high priority message to [employer] to see if they need something from [place] in order for patient to get supplies.

## 2022-10-10 NOTE — Telephone Encounter (Signed)
Brad from [employer] states he has forwarded the message to [facility] to address.

## 2022-10-10 NOTE — Telephone Encounter (Signed)
Pt stated she needs for someone to call her insurance and let them know she has a CPAP machine. Stated she needs CPAP supplies from [employer] but insurance want pay before talking to someone.

## 2023-01-20 ENCOUNTER — Telehealth: Payer: Self-pay | Admitting: Neurology

## 2023-01-20 NOTE — Telephone Encounter (Signed)
Pt rescheduled appt due to scheduling conflict

## 2023-01-23 NOTE — Progress Notes (Unsigned)
PATIENT: Jennifer Alvarez [facility] DOB: 1950/12/27  REASON FOR VISIT: follow up HISTORY FROM: patient PRIMARY NEUROLOGIST: Dr. Frances Furbish  HISTORY OF PRESENT ILLNESS: Today 01/26/23:  Jennifer Alvarez is a 72 y.o. female with a history of OSA on CPAP and veritgo. Returns today for follow-up.   Last visit with athar sent to vestibular rehab     01/29/2022 (copied from Dr. Teofilo Pod note) : She requested a sooner appointment due to a recent episode of vertigo, it was similar to what she had before.  She had sudden onset of spinning sensation, no vomiting, no one-sided weakness or numbness or tingling, symptoms lasted for about 24 hours.  She did have a dull headache, nothing like her previous migraines, she takes Tylenol as needed, she does not really have any severe migraines any longer.  She is faithful with her AutoPap machine.  I was able to review her compliance data from 12/27/2021 through 01/25/2022, which is a total of 30 days, during which time she used her machine every night with percent use days greater than 4 hours at 93%, indicating excellent compliance with an average usage of 7 hours and 11 minutes, residual AHI at goal at 4/h, pressure for the 95th percentile at 10.4 cm with a range of 5 to 11 cm with EPR of 2.  Leak on the higher side with the 95th percentile at 32 L/min.  She has adjusted well to treatment, she is pleased with her AutoPap machine, she had to change machines due to a defect in her previous machine.  She still indicates good benefit from treatment.  She has not seen ENT recently.  She had physical [facility] through [facility] in the past, she still has some of the exercises that they gave her, she tried to do those exercises but it did not seem to help.  She is agreeable to a referral to physical therapy.  No obvious trigger for this last vertigo attack, had a normal day, tries to keep well-hydrated on a day-to-day basis.   06/04/21: Jennifer Alvarez is a 72 year old female with  a history of obstructive sleep apnea on CPAP and migraine headaches.  She returns today for follow-up.  We have not been able to get a download since she got the new machine.  She supposed be getting a different machine from her DME company.  She reports that since using the machine she is only had 2 migraine headaches.  Denies any new issues.  Returns today for an evaluation.   HISTORY (copied from Dr. Teofilo Pod note) 12/04/2020: I was not able to review her AutoPap data today as the serial number was not typed incorrectly and we were not tagged correctly.  Patient reports that she is using her machine nightly, she has had some difficulty adjusting, initially the machine was turned off spontaneously a few times and she had to take it back to the DME provider.  There was another occasion where the machine turned on spontaneously during the day.  She took the machine back.  She was told that she may need a replacement if she had ongoing issues with it.  She reports some modest improvement in her sleep consolidation and daytime energy.  She no longer snores audibly per husband.  She had to adjust the mask because of leaking.  The mouth is quite dry in the mornings although she has the humidity on a higher setting.  The humidifier should be worn but she reports that she only is  feeling cool air.  She may need to get this looked at again by the DME provider.  As far as her migraines, she has not had a migraine attack, she could not fill the [place] due to cost.  She has not even had to use Tylenol, she has had an occasional frontal pressure-like headache but it does not last more than 10 or 15 minutes at a time.  She has had occasional positional dizziness which is brief, typically when she is standing up too quickly but no significant vertigo-like attack.  She has been using bilateral hearing aids.  She has not had any new concerns.  She has not had a follow-up with ENT.  REVIEW OF SYSTEMS: Out of a complete 14  system review of symptoms, the patient complains only of the following symptoms, and all other reviewed systems are negative.    ALLERGIES: Allergies  Allergen Reactions   Bee Venom Anaphylaxis   Peanut-Containing Drug Products Anaphylaxis    Itching around mouth and throat   Other Swelling    Tree pollen Congestion, watery eyes, swelling to face Eliseo Squires   Vicodin [Hydrocodone-Acetaminophen] Nausea And Vomiting   Celebrex [Celecoxib] Palpitations   Demerol Rash   Valium Rash    HOME MEDICATIONS: Outpatient Medications Prior to Visit  Medication Sig Dispense Refill   amLODipine (NORVASC) 5 MG tablet Take 1 tablet by mouth daily.     estradiol (VIVELLE-DOT) 0.025 MG/24HR Place 1 patch onto the skin 2 (two) times a week.     fluticasone (FLONASE) 50 MCG/ACT nasal spray 2 sprays daily.     loratadine (CLARITIN) 10 MG tablet Take 10 mg by mouth daily.     metoCLOPramide (REGLAN) 5 MG tablet Take three times a day with meals or daily at bedtime as needed (Patient taking differently: daily at 2 PM.) 90 tablet 1   Multiple Vitamin (MULTI-VITAMIN) tablet Take 1 tablet by mouth daily.     Multiple Vitamins-Minerals (PRESERVISION AREDS 2 PO) Take 1 tablet by mouth daily.     omeprazole (PRILOSEC) 40 MG capsule TAKE 1 CAPSULE BY MOUTH TWICE A DAY 180 capsule 2   PRESCRIPTION MEDICATION Tree serum injection administered by nurse at the patient's office     sertraline (ZOLOFT) 25 MG tablet Take 25 mg by mouth daily.     No facility-administered medications prior to visit.    PAST MEDICAL HISTORY: Past Medical History:  Diagnosis Date   Allergy    Cataract    minor   Complication of anesthesia    states chipped teeth with intubation   Fuchs' corneal dystrophy    Gastroparesis 08/19/2000   GERD (gastroesophageal reflux disease)    Hearing loss    hearing aids   Hiatal hernia    Migraine    migraines in past   Osteoporosis    PONV (postoperative nausea and vomiting)    Seasonal  allergies    Sleep apnea    Resvent Machine uses distilled water. 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   Vertigo     PAST SURGICAL HISTORY: Past Surgical History:  Procedure Laterality Date   ABDOMINAL HYSTERECTOMY  2005   BLADDER REPAIR     BUNIONECTOMY     right foot   COLONOSCOPY     LAPAROSCOPIC NISSEN FUNDOPLICATION  12/18/2011   Procedure: LAPAROSCOPIC NISSEN  FUNDOPLICATION;  Surgeon: Valarie Merino, MD;  Location: [facility];  Service: General;  Laterality: N/A;  Redo Lap Nissen Fundoplication takedown of herniated nissen fundoplication redo hiatal hernia repair   NISSEN FUNDOPLICATION  2002   x2 2002, 2013   PELVIC LAPAROSCOPY     hysterectomy   RHINOPLASTY  1980    FAMILY HISTORY: Family History  Problem Relation Age of Onset   Hypertension Mother        age 56   Anxiety disorder Mother    Depression Mother    Asthma Mother    COPD Mother    Stroke Mother    Anorexia nervosa Mother    Heart disease Father    Esophageal cancer Father 76       died age 80   Migraines Father    Colon cancer Neg Hx    Rectal cancer Neg Hx    Stomach cancer Neg Hx    Pancreatic cancer Neg Hx    Colon polyps Neg Hx     SOCIAL HISTORY: Social History   Socioeconomic History   Marital status: Married    Spouse name: Fayrene Fearing   Number of children: 2   Years of education: Not on file   Highest education level: Bachelor's degree (e.g., BA, AB, BS)  Occupational History    Employer: [employer]    Comment: retired  Tobacco Use   Smoking status: Never   Smokeless tobacco: Never  Vaping Use   Vaping status: Never Used  Substance and Sexual Activity   Alcohol use: Never   Drug use: Never   Sexual activity: Not on file  Other Topics Concern   Not on file  Social History Narrative   Lives with husband   No caffeine    Social  Determinants of Health   Financial Resource Strain: Low Risk  (06/15/2020)   [facility] prior to 06/29/2022., [facility] prior to 06/29/2022.   Overall Financial Resource Strain (CARDIA)    Difficulty of Paying Living Expenses: Not hard at all  Food Insecurity: Low Risk  (08/21/2022)   [facility]   Hunger Vital Sign    Worried About Running Out of Food in the Last Year: Never true    Ran Out of Food in the Last Year: Never true  Transportation Needs: No Transportation Needs (08/21/2022)   [facility]   Transportation    In the past 12 months, has lack of reliable transportation kept you from medical appointments, meetings, work or from getting things needed for daily living? : No  Physical Activity: Insufficiently Active (06/15/2020)   Received from [facility] visits prior to 06/29/2022., [facility] visits prior to 06/29/2022.   Exercise Vital Sign    Days of Exercise per Week: 3 days    Minutes of Exercise per Session: 30 min  Stress: Stress Concern Present (06/15/2020)   [facility] prior to 06/29/2022., [facility] visits prior to 06/29/2022.   [employer] of Occupational Health - Occupational Stress Questionnaire    Feeling of Stress : To some extent  Social Connections: Moderately Integrated (06/15/2020)   [facility] visits prior to 06/29/2022., [facility] visits prior to 06/29/2022.   Social Connection and Isolation Panel [NHANES]    Frequency  of Communication with Friends and Family: Twice a week    Frequency of Social Gatherings with Friends and Family: Once a week    Attends Religious Services: More than 4 times per year    Active Member of [employer] or Organizations: No    Attends Tax inspector Meetings: Never    Marital Status: Married  Catering manager Violence: Not At Risk (06/15/2020)   [facility] prior to 06/29/2022., [facility] visits prior to 06/29/2022.   Humiliation, Afraid, Rape, and Kick questionnaire    Fear of Current or Ex-Partner: No    Emotionally Abused: No    Physically Abused: No    Sexually Abused: No      PHYSICAL EXAM  There were no vitals filed for this visit.  There is no height or weight on file to calculate BMI.  Generalized: Well developed, in no acute distress  Chest: Lungs clear to auscultation bilaterally  Neurological examination  Mentation: Alert oriented to time, place, history taking. Follows all commands speech and language fluent Cranial nerve II-XII: Extraocular movements were full, visual field were full on confrontational test Head turning and shoulder shrug  were normal and symmetric. Motor: The motor testing reveals 5 over 5 strength of all 4 extremities. Good symmetric motor tone is noted throughout.  Sensory: Sensory testing is intact to soft touch on all 4 extremities. No evidence of extinction is noted.  Gait and station: Gait is normal.    DIAGNOSTIC DATA (LABS, IMAGING, TESTING) - I reviewed patient records, labs, notes, testing and imaging myself where available.  Lab Results  Component Value Date   WBC 8.5 12/20/2011   HGB 10.8 (L) 12/20/2011   HCT 33.8 (L) 12/20/2011   MCV 81.6 12/20/2011   PLT 254 12/20/2011      Component Value Date/Time   NA 140 12/12/2011 1545   K 3.9 12/12/2011 1545   CL 102 12/12/2011 1545   CO2 29 12/12/2011 1545   GLUCOSE 107 (H) 12/12/2011 1545   BUN 9 06/17/2016 1631   CREATININE 0.82 06/17/2016 1631   CALCIUM 9.3 12/12/2011 1545   GFRNONAA 88 (L) 12/18/2011 1455   GFRAA >90 12/18/2011 1455     ASSESSMENT AND PLAN 72 y.o. year old female  has a past medical history of Allergy, Cataract, Complication  of anesthesia, Fuchs' corneal dystrophy, Gastroparesis (08/19/2000), GERD (gastroesophageal reflux disease), Hearing loss, Hiatal hernia, Migraine, Osteoporosis, PONV (postoperative nausea and vomiting), Seasonal allergies, Sleep apnea, Sleep apnea with use of continuous positive airway pressure (CPAP), Thyroid disease, and Vertigo. here with:  OSA on CPAP  -Reports that she uses it nightly. -Unable to view a download - Encourage patient to use CPAP nightly and > 4 hours each night -Follow-up with DME company regarding new machine  2.  Migraine headaches  -Improvement since started on CPAP -Continue to monitor for now  Follow-up in 1 year or sooner if needed     Butch Penny, MSN, NP-C 01/26/2023, 2:58 PM [facility] [address] [place], [place] [address] [phone]

## 2023-01-27 ENCOUNTER — Ambulatory Visit: Payer: PPO | Admitting: Adult Health

## 2023-01-27 VITALS — BP 124/67 | HR 68 | Ht 58.5 in | Wt 128.0 lb

## 2023-01-27 DIAGNOSIS — H8110 Benign paroxysmal vertigo, unspecified ear: Secondary | ICD-10-CM | POA: Diagnosis not present

## 2023-01-27 DIAGNOSIS — G4733 Obstructive sleep apnea (adult) (pediatric): Secondary | ICD-10-CM | POA: Diagnosis not present

## 2023-01-27 NOTE — Patient Instructions (Signed)
Your Plan:  Continue using CPAP nightly and greater than 4 hours each night Mask refitting If your symptoms worsen or you develop new symptoms please let us know.    Thank you for coming to see [place] at [facility]. I hope we have been able to provide you high quality care today.  You may receive a patient satisfaction survey over the next few weeks. We would appreciate your feedback and comments so that we may continue to improve ourselves and the health of our patients.

## 2023-01-29 ENCOUNTER — Telehealth: Payer: Self-pay | Admitting: *Deleted

## 2023-01-29 NOTE — Telephone Encounter (Signed)
Mask refit order sent to [facility].

## 2023-01-29 NOTE — Telephone Encounter (Signed)
[facility] confirmed receipt of order.  

## 2023-01-30 ENCOUNTER — Ambulatory Visit: Payer: PPO | Admitting: Adult Health

## 2023-06-25 ENCOUNTER — Ambulatory Visit: Payer: PPO | Admitting: Gastroenterology

## 2023-06-25 ENCOUNTER — Encounter: Payer: Self-pay | Admitting: Gastroenterology

## 2023-06-25 VITALS — BP 120/70 | HR 75 | Ht 58.5 in | Wt 129.4 lb

## 2023-06-25 DIAGNOSIS — K219 Gastro-esophageal reflux disease without esophagitis: Secondary | ICD-10-CM

## 2023-06-25 DIAGNOSIS — Z9889 Other specified postprocedural states: Secondary | ICD-10-CM

## 2023-06-25 DIAGNOSIS — Z860101 Personal history of adenomatous and serrated colon polyps: Secondary | ICD-10-CM | POA: Diagnosis not present

## 2023-06-25 DIAGNOSIS — K2270 Barrett's esophagus without dysplasia: Secondary | ICD-10-CM | POA: Diagnosis not present

## 2023-06-25 MED ORDER — SUCRALFATE 1 G PO TABS: 1.0000 g | ORAL_TABLET | Freq: Four times a day (QID) | ORAL | 3 refills | Status: DC

## 2023-06-25 MED ORDER — SUTAB 1479-225-188 MG PO TABS: ORAL_TABLET | ORAL | 0 refills | Status: DC

## 2023-06-25 NOTE — Patient Instructions (Addendum)
 We have sent the following medications to your pharmacy for you to pick up at your convenience: Sucralfate (Carafate) take 1 tablet 4 times daily, 1 hour before meals.  You have been scheduled for an endoscopy and colonoscopy. Please follow the written instructions given to you at your visit today.  If you use inhalers (even only as needed), please bring them with you on the day of your procedure.  DO NOT TAKE 7 DAYS PRIOR TO TEST- Trulicity (dulaglutide) Ozempic, Wegovy (semaglutide) Mounjaro (tirzepatide) Bydureon Bcise (exanatide extended release)  DO NOT TAKE 1 DAY PRIOR TO YOUR TEST Rybelsus (semaglutide) Adlyxin (lixisenatide) Victoza (liraglutide) Byetta (exanatide) ___________________________________________________________________________  Bonita Quin will receive your bowel preparation through [facility], which ensures the lowest copay and home delivery, with outreach via text or call from an [phone] number. Please respond promptly to avoid rescheduling of your procedure. If you are interested in alternative options or have any questions regarding your prep, please contact them at [phone] ____________________________________________________________________________  Your Provider Has Sent Your Bowel Prep Regimen To [facility]   [facility] will contact you to verify your information and collect your copay, if applicable. Enjoy the comfort of your home while your prescription is mailed to you, FREE of any shipping charges.   [facility] accepts all major insurance benefits and applies discounts & coupons.  Have additional questions?   Chat: [email or web address] Call: [phone] Email: [email or web address] NCPDP: [id number]  How will [facility] contact you?  With a Welcome phone call,  a Welcome text and a checkout link in text form.  Texts you receive from [phone] Are NOT Spam.  *To set up delivery, you must complete the checkout process via link or speak to  one of the patient care representatives. If [facility] is unable to reach you, your prescription may be delayed.  To avoid long hold times on the phone, you may also utilize the secure chat feature on the [facility] website to request that they call you back for transaction completion or to expedite your concerns.   Thank you for trusting me with your gastrointestinal care!   Boone Master, PA   _______________________________________________________  If your blood pressure at your visit was 140/90 or greater, please contact your primary care physician to follow up on this.  _______________________________________________________  If you are age 73 or older, your body mass index should be between 23-30. Your Body mass index is 26.58 kg/m. If this is out of the aforementioned range listed, please consider follow up with your Primary Care Provider.  If you are age 44 or younger, your body mass index should be between 19-25. Your Body mass index is 26.58 kg/m. If this is out of the aformentioned range listed, please consider follow up with your Primary Care Provider.   ________________________________________________________  The [facility] providers would like to encourage you to use [facility] to communicate with providers for non-urgent requests or questions.  Due to long hold times on the telephone, sending your provider a message by [facility] may be a faster and more efficient way to get a response.  Please allow 48 business hours for a response.  Please remember that this is for non-urgent requests.  _______________________________________________________

## 2023-06-25 NOTE — Progress Notes (Signed)
 Chief Complaint: Barrett's esophagus Primary GI MD: Dr. Adela Lank  HPI: 73 year old female history of GERD, Barrett's esophagus, hiatal hernia, gastroparesis schedule, presents for evaluation of Barrett's esophagus  History of Nissen fundoplication 2003 with a Niesen fundoplication redo in 2013.  Last seen 2023 by Dr. Adela Lank.   Has history of 2 reflux surgeries and failed high-dose PPI to control symptoms.  She has history of Barrett's esophagus (short segment).  Has tried to wean off PPI with recurrent symptoms.  At last office visit it was recommended to continue omeprazole 40 Mg twice daily and Gaviscon for breakthrough.  Reglan was discussed for control of her gastroparesis and she was recommended to try 5 Mg 3 times daily.  CT was offered but not pursued.  Due for repeat EGD 2025  -----------------TODAY------------------------- Discussed the use of AI scribe software for clinical note transcription with the patient, who gave verbal consent to proceed.  Jennifer Alvarez is a 73 year old female with gastroesophageal reflux disease who presents for a repeat endoscopy and is also due for a colonoscopy.  She has gastroesophageal reflux disease (GERD) and is currently taking omeprazole 40 mg twice a day. Despite this, she experiences breakthrough symptoms, particularly when she eats certain foods. She has tried using 20 mg over-the-counter omeprazole, but it is  insufficient, causing her to wake up with symptoms. She is frustrated with the dependence on medication and has had extensive discussions with her previous doctor about this.  Her reflux is controlled by eating, but she cannot achieve a feeling of fullness comparable to others. She avoids foods like onions and peppers as they exacerbate her symptoms. She has a history of a failed Nissen fundoplication in 2013, which has contributed to her ongoing reflux issues. If she does not eat, she does not experience reflux, but acknowledges that  not eating is not a viable solution.  No lower GI issues. No history of diabetes and not on any blood thinners, only taking a low-dose blood pressure medication.     PREVIOUS GI WORKUP   EGD - July 2016 per Dr. Juanda Chance with finding of grade B distal esophagitis and a 4 cm hiatal hernia and functioning fundoplication.  Biopsies from the esophagus show changes consistent with GERD, no Barrett's.   Colonoscopy - February 2018 with multiple diverticuli in the left colon, 2 polyps were removed, one was a tubular adenoma, one was hyperplastic.   [place] 06/28/19 - IMPRESSION: GB normal no stones Probable fatty infiltration of liver as above. Otherwise negative exam.   CT abdomen / pelvis 07/29/2016: IMPRESSION: 1. A specific cause for chronic right lower quadrant pain is not identified. 2. Distal esophageal wall thickening (probably from esophagitis) with a small to moderate-sized hiatal hernia and what appears to be a partially un wrapped Nissen fundoplication also herniated as part of the hiatal hernia. 3. Left foraminal impingement at L5-S1 due to spurring. 4. Sigmoid diverticulosis without active diverticulitis. 5.  Prominent stool throughout the colon favors constipation.     EGD 09/20/20: - A 4 cm hiatal hernia was present. - The Z-line was irregular and was found 35 cm from the incisors, with a tongue of salmon colored mucosa about 1cm in length. Biopsies were taken with a cold forceps for histology. - The examined esophagus was tortuous with an angulated turn entering the hernia sac. - The exam of the esophagus was otherwise normal. No stenosis / stricture appreciated. - Evidence of a Nissen fundoplication was found in the cardia. It was  slightly loose. - The exam of the stomach was otherwise normal. - The duodenal bulb and second portion of the duodenum were normal.   Surgical [P], Irregular z line bx - BARRETT ESOPHAGUS - NO DYSPLASIA OR MALIGNANCY IDENTIFIED  Past Medical  History:  Diagnosis Date   Allergy    Cataract    minor   Complication of anesthesia    states chipped teeth with intubation   Fuchs' corneal dystrophy    Gastroparesis 08/19/2000   GERD (gastroesophageal reflux disease)    Hearing loss    hearing aids   Hiatal hernia    Migraine    migraines in past   Osteoporosis    PONV (postoperative nausea and vomiting)    Seasonal allergies    Sleep apnea    Resvent Machine uses distilled water. 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   Vertigo     Past Surgical History:  Procedure Laterality Date   ABDOMINAL HYSTERECTOMY  2005   BLADDER REPAIR     BUNIONECTOMY     right foot   COLONOSCOPY     LAPAROSCOPIC NISSEN FUNDOPLICATION  12/18/2011   Procedure: LAPAROSCOPIC NISSEN FUNDOPLICATION;  Surgeon: Valarie Merino, MD;  Location: [facility];  Service: General;  Laterality: N/A;  Redo Lap Nissen Fundoplication takedown of herniated nissen fundoplication redo hiatal hernia repair   NISSEN FUNDOPLICATION  2002   x2 2002, 2013   PELVIC LAPAROSCOPY     hysterectomy   RHINOPLASTY  1980    Current Outpatient Medications  Medication Sig Dispense Refill   amLODipine (NORVASC) 5 MG tablet Take 1 tablet by mouth daily.     estradiol (VIVELLE-DOT) 0.025 MG/24HR Place 1 patch onto the skin 2 (two) times a week.     fluticasone (FLONASE) 50 MCG/ACT nasal spray 2 sprays daily.     glucosamine-chondroitin 500-400 MG tablet Take 2 tablets by mouth daily.     loratadine (CLARITIN) 10 MG tablet Take 10 mg by mouth daily.     Multiple Vitamin (MULTI-VITAMIN) tablet Take 1 tablet by mouth daily.     Multiple Vitamins-Minerals (PRESERVISION AREDS 2 PO) Take 1 tablet by mouth daily.     omeprazole (PRILOSEC) 40 MG capsule TAKE 1 CAPSULE BY MOUTH TWICE A DAY 180 capsule 2   sertraline (ZOLOFT) 25 MG tablet Take 25 mg by mouth  daily.     No current facility-administered medications for this visit.    Allergies as of 06/25/2023 - Review Complete 06/25/2023  Allergen Reaction Noted   Bee venom Anaphylaxis 09/17/2011   Peanut-containing drug products Anaphylaxis 09/17/2011   Other Swelling 09/17/2011   Vicodin [hydrocodone-acetaminophen] Nausea And Vomiting 07/07/2011   Celebrex [celecoxib] Palpitations 07/07/2011   Demerol Rash 07/07/2011   Valium Rash 07/07/2011    Family History  Problem Relation Age of Onset   Hypertension Mother        age 54   Anxiety disorder Mother    Depression Mother    Asthma Mother    COPD Mother    Stroke Mother    Anorexia nervosa Mother    Heart disease Father    Esophageal cancer Father 83       died age 67   Migraines Father    Colon cancer Neg Hx    Rectal cancer Neg Hx    Stomach  cancer Neg Hx    Pancreatic cancer Neg Hx    Colon polyps Neg Hx     Social History   Socioeconomic History   Marital status: Married    Spouse name: Fayrene Fearing   Number of children: 2   Years of education: Not on file   Highest education level: Bachelor's degree (e.g., BA, AB, BS)  Occupational History    Employer: [employer]    Comment: retired  Tobacco Use   Smoking status: Never   Smokeless tobacco: Never  Vaping Use   Vaping status: Never Used  Substance and Sexual Activity   Alcohol use: Never   Drug use: Never   Sexual activity: Not on file  Other Topics Concern   Not on file  Social History Narrative   Lives with husband   No caffeine    Social Drivers of Corporate investment banker Strain: Low Risk  (06/15/2020)   [facility] prior to 06/29/2022., [facility] visits prior to 06/29/2022.   Overall Financial Resource Strain (CARDIA)    Difficulty of Paying Living Expenses: Not hard at all  Food Insecurity: Low Risk  (08/21/2022)   [facility]   Hunger Vital  Sign    Worried About Running Out of Food in the Last Year: Never true    Ran Out of Food in the Last Year: Never true  Transportation Needs: No Transportation Needs (08/21/2022)   [facility]   Transportation    In the past 12 months, has lack of reliable transportation kept you from medical appointments, meetings, work or from getting things needed for daily living? : No  Physical Activity: Insufficiently Active (06/15/2020)   [facility] prior to 06/29/2022., [facility] visits prior to 06/29/2022.   Exercise Vital Sign    Days of Exercise per Week: 3 days    Minutes of Exercise per Session: 30 min  Stress: Stress Concern Present (06/15/2020)   [facility] prior to 06/29/2022., [facility] visits prior to 06/29/2022.   [employer] of Occupational Health - Occupational Stress Questionnaire    Feeling of Stress : To some extent  Social Connections: Moderately Integrated (06/15/2020)   Received from [facility] prior to 06/29/2022., [facility] visits prior to 06/29/2022.   Social Advertising account executive [NHANES]    Frequency of Communication with Friends and Family: Twice a week    Frequency of Social Gatherings with Friends and Family: Once a week    Attends Religious Services: More than 4 times per year    Active Member of [employer] or Organizations: No    Attends Banker Meetings: Never    Marital Status: Married  Catering manager Violence: Not At Risk (06/15/2020)   [facility] prior to 06/29/2022., [facility] visits prior to 06/29/2022.   Humiliation, Afraid, Rape, and Kick questionnaire    Fear of Current or Ex-Partner: No    Emotionally Abused: No    Physically Abused: No    Sexually Abused: No     Review of Systems:    Constitutional: No weight loss, fever, chills, weakness or fatigue HEENT: Eyes: No change in vision  Ears, Nose, Throat:  No change in hearing or congestion Skin: No rash or itching Cardiovascular: No chest pain, chest pressure or palpitations   Respiratory: No SOB or cough Gastrointestinal: See HPI and otherwise negative Genitourinary: No dysuria or change in urinary frequency Neurological: No headache, dizziness or syncope Musculoskeletal: No new muscle or joint pain Hematologic: No bleeding or bruising Psychiatric: No history of depression or anxiety    Physical Exam:  Vital signs: BP 120/70   Pulse 75   Ht 4' 10.5" (1.486 m)   Wt 129 lb 6.4 oz (58.7 kg)   BMI 26.58 kg/m   Constitutional: NAD, Well developed, Well nourished, alert and cooperative Head:  Normocephalic and atraumatic. Eyes:   PEERL, EOMI. No icterus. Conjunctiva pink. Respiratory: Respirations even and unlabored. Lungs clear to auscultation bilaterally.   No wheezes, crackles, or rhonchi.  Cardiovascular:  Regular rate and rhythm. No peripheral edema, cyanosis or pallor.  Gastrointestinal:  Soft, nondistended, nontender. No rebound or guarding. Normal bowel sounds. No appreciable masses or hepatomegaly. Rectal:  Not performed.  Msk:  Symmetrical without gross deformities. Without edema, no deformity or joint abnormality.  Neurologic:  Alert and  oriented x4;  grossly normal neurologically.  Skin:   Dry and intact without significant lesions or rashes. Psychiatric: Oriented to person, place and time. Demonstrates good judgement and reason without abnormal affect or behaviors.    RELEVANT LABS AND IMAGING: CBC    Component Value Date/Time   WBC 8.5 12/20/2011 0353   RBC 4.14 12/20/2011 0353   HGB 10.8 (L) 12/20/2011 0353   HCT 33.8 (L) 12/20/2011 0353   PLT 254 12/20/2011 0353   MCV 81.6 12/20/2011 0353   MCH 26.1 12/20/2011 0353   MCHC 32.0 12/20/2011 0353    RDW 15.4 12/20/2011 0353   LYMPHSABS 1.1 12/20/2011 0353   MONOABS 0.9 12/20/2011 0353   EOSABS 0.0 12/20/2011 0353   BASOSABS 0.0 12/20/2011 0353    CMP     Component Value Date/Time   NA 140 12/12/2011 1545   K 3.9 12/12/2011 1545   CL 102 12/12/2011 1545   CO2 29 12/12/2011 1545   GLUCOSE 107 (H) 12/12/2011 1545   BUN 9 06/17/2016 1631   CREATININE 0.82 06/17/2016 1631   CALCIUM 9.3 12/12/2011 1545   GFRNONAA 88 (L) 12/18/2011 1455   GFRAA >90 12/18/2011 1455     Assessment/Plan:      GERD Barrett's esophagus S/p Nissen fundoplication Uncontrolled symptoms despite high-dose Omeprazole (80mg  daily). History of failed Nissen fundoplication in 2013. Symptoms are somewhat mitigated by dietary modifications.  Question whether or not patient is a TIF procedure candidate.  Unlikely given her 4 cm hiatal hernia since typically it is recommended to do with hiatal hernia under 2 cm. - Add on Carafate at morning and at bedtime.  Advised of constipation side effects.  Can use up to 4 times a day - Continue PPI twice daily - EGD for surveillance of Barrett's - I thoroughly discussed the procedure with the patient (at bedside) to include nature of the procedure, alternatives, benefits, and risks (including but not limited to bleeding, infection, perforation, anesthesia/cardiac pulmonary complications).  Patient verbalized understanding and gave verbal consent to proceed with procedure. - Advised to send MyChart message on update with Carafate in 3 to 4 weeks - Follow-up per procedure - Will discuss TIF candidacy with Dr. Adela Lank  History of colon polyps Last colonoscopy in 2018.  She is due for repeat. - Schedule colonoscopy - I thoroughly  discussed the procedure with the patient (at bedside) to include nature of the procedure, alternatives, benefits, and risks (including but not limited to bleeding, infection, perforation, anesthesia/cardiac pulmonary complications).  Patient  verbalized understanding and gave verbal consent to proceed with procedure.    Lara Mulch [facility] 06/25/2023, 9:44 AM  Cc: Richmond Campbell., PA-C

## 2023-06-25 NOTE — Progress Notes (Signed)
 Agree with assessment and plan with the following thoughts:  Patient warrants an EGD prior to consideration for any intervention on the hernia or reflux procedure.  I do not believe she is a candidate for TIF given her history of prior Nissen fundoplication.  She would likely require surgical hiatal hernia repair and redo fundoplication if refractory symptoms but can await her EGD with further recommendations.  Continue medical therapy until then. Thanks

## 2023-07-16 ENCOUNTER — Telehealth: Payer: Self-pay | Admitting: Gastroenterology

## 2023-07-16 NOTE — Telephone Encounter (Signed)
 Patient's PCP sent labs for this patient showing iron deficiency with mild anemia. Will be evaluated during upcoming EGD and colonoscopy

## 2023-09-08 ENCOUNTER — Encounter (HOSPITAL_COMMUNITY): Payer: Self-pay

## 2023-09-08 ENCOUNTER — Encounter: Payer: Self-pay | Admitting: Gastroenterology

## 2023-09-15 ENCOUNTER — Encounter: Payer: Self-pay | Admitting: Gastroenterology

## 2023-09-15 ENCOUNTER — Ambulatory Visit: Payer: PPO | Admitting: Gastroenterology

## 2023-09-15 VITALS — BP 126/62 | HR 68 | Temp 97.7°F | Resp 14 | Ht 58.5 in | Wt 129.0 lb

## 2023-09-15 DIAGNOSIS — K2289 Other specified disease of esophagus: Secondary | ICD-10-CM

## 2023-09-15 DIAGNOSIS — K3189 Other diseases of stomach and duodenum: Secondary | ICD-10-CM

## 2023-09-15 DIAGNOSIS — Z860101 Personal history of adenomatous and serrated colon polyps: Secondary | ICD-10-CM

## 2023-09-15 DIAGNOSIS — K317 Polyp of stomach and duodenum: Secondary | ICD-10-CM | POA: Diagnosis not present

## 2023-09-15 DIAGNOSIS — K219 Gastro-esophageal reflux disease without esophagitis: Secondary | ICD-10-CM

## 2023-09-15 DIAGNOSIS — K2270 Barrett's esophagus without dysplasia: Secondary | ICD-10-CM | POA: Diagnosis not present

## 2023-09-15 DIAGNOSIS — D509 Iron deficiency anemia, unspecified: Secondary | ICD-10-CM

## 2023-09-15 DIAGNOSIS — T859XXA Unspecified complication of internal prosthetic device, implant and graft, initial encounter: Secondary | ICD-10-CM

## 2023-09-15 DIAGNOSIS — Z1211 Encounter for screening for malignant neoplasm of colon: Secondary | ICD-10-CM

## 2023-09-15 DIAGNOSIS — K648 Other hemorrhoids: Secondary | ICD-10-CM

## 2023-09-15 DIAGNOSIS — K5730 Diverticulosis of large intestine without perforation or abscess without bleeding: Secondary | ICD-10-CM

## 2023-09-15 DIAGNOSIS — Q399 Congenital malformation of esophagus, unspecified: Secondary | ICD-10-CM

## 2023-09-15 MED ORDER — VOQUEZNA 10 MG PO TABS: 10.0000 mg | ORAL_TABLET | Freq: Every day | ORAL | Status: DC

## 2023-09-15 MED ORDER — SODIUM CHLORIDE 0.9 % IV SOLN: 500.0000 mL | Freq: Once | INTRAVENOUS | Status: DC

## 2023-09-15 NOTE — Progress Notes (Signed)
 Called to room to assist during endoscopic procedure.  Patient ID and intended procedure confirmed with present staff. Received instructions for my participation in the procedure from the performing physician.

## 2023-09-15 NOTE — Op Note (Signed)
 [facility] Patient Name: Jennifer Alvarez Procedure Date: 09/15/2023 7:48 AM MRN: [id number] Endoscopist: Landon Pinion P. General Ken[place] , MD, [id number] Age: 73 Referring MD:  Date of Birth: 04-Jan-1951 Gender: Female Account #: [id number] Procedure:                Upper GI endoscopy Indications:              Iron deficiency anemia, Follow-up of                            gastro-esophageal reflux disease with short segment                            BE - history of Nissen fundoplication, intermittent                            reflux symptoms on omeprazole  40mg . Added carafate                             which has helped Medicines:                Monitored Anesthesia Care Procedure:                Pre-Anesthesia Assessment:                           - Prior to the procedure, a History and Physical                            was performed, and patient medications and                            allergies were reviewed. The patient's tolerance of                            previous anesthesia was also reviewed. The risks                            and benefits of the procedure and the sedation                            options and risks were discussed with the patient.                            All questions were answered, and informed consent                            was obtained. Prior Anticoagulants: The patient has                            taken no anticoagulant or antiplatelet agents. ASA                            Grade Assessment: II - A patient with mild systemic  disease. After reviewing the risks and benefits,                            the patient was deemed in satisfactory condition to                            undergo the procedure.                           After obtaining informed consent, the endoscope was                            passed under direct vision. Throughout the                            procedure, the patient's blood pressure,  pulse, and                            oxygen saturations were monitored continuously. The                            Olympus Scope [id number] was introduced through the                            mouth, and advanced to the second part of duodenum.                            The upper GI endoscopy was accomplished without                            difficulty. The patient tolerated the procedure                            well. Scope In: Scope Out: Findings:                 Esophagogastric landmarks were identified: the                            Z-line was found at 31 cm, the gastroesophageal                            junction was found at 31 cm and the upper extent of                            the gastric folds was found at 35 cm from the                            incisors.                           A suspected 4 cm hiatal hernia was present. Mildly                            erythematous mucosa within  the hernia sac.                           The Z-line was irregular - roughly 1cm tongue of                            salmon colored mucosa. Biopsies were taken with a                            cold forceps for histology.                           The distal esophagus was tortuous with an angulated                            turn into the hernia sac.                           The exam of the esophagus was otherwise normal.                           A single 3 mm sessile polyp was found in the                            gastric body. The polyp was removed with a cold                            biopsy forceps. Likely fundic gland polyp - rule                            out adenoma. Resection and retrieval were complete.                           Patchy mildly erythematous mucosa was found in the                            gastric body.                           Evidence of a Nissen fundoplication was found in                            the cardia. The wrap appeared loose.                            The exam of the stomach was otherwise normal.                           Biopsies were taken with a cold forceps for                            Helicobacter pylori testing.  The examined duodenum was normal. Complications:            No immediate complications. Estimated blood loss:                            Minimal. Estimated Blood Loss:     Estimated blood loss was minimal. Impression:               - Esophagogastric landmarks identified.                           - 4 cm hiatal hernia.                           - Z-line irregular. Biopsied.                           - Tortuous esophagus.                           - Normal esophagus otherwise.                           - A single gastric polyp. Resected and retrieved.                           - Erythematous mucosa in the gastric body.                           - A Nissen fundoplication was found. The wrap                            appears loose.                           - Normal stomach otherwise - biopsies taken to rule                            out H pylori.                           - Normal examined duodenum. Recommendation:           - Patient has a contact number available for                            emergencies. The signs and symptoms of potential                            delayed complications were discussed with the                            patient. Return to normal activities tomorrow.                            Written discharge instructions were provided to the  patient.                           - Resume previous diet.                           - Continue present medications.                           - Consider switch from omeprazole  to Voquezna  10mg                             / day if covered by insurance (we can try some free                            samples if available in the office)                           - Await pathology results. Landon Pinion  P. Jennifer Skates, MD 09/15/2023 8:42:37 AM This report has been signed electronically.

## 2023-09-15 NOTE — Op Note (Signed)
 [facility] Patient Name: Jennifer Alvarez Procedure Date: 09/15/2023 7:47 AM MRN: [id number] Endoscopist: Landon Pinion P. General Kenner , MD, [id number] Age: 73 Referring MD:  Date of Birth: 1950/09/20 Gender: Female Account #: [id number] Procedure:                Colonoscopy Indications:              Iron deficiency anemia, history of adenoma removed                            2018 Medicines:                Monitored Anesthesia Care Procedure:                Pre-Anesthesia Assessment:                           - Prior to the procedure, a History and Physical                            was performed, and patient medications and                            allergies were reviewed. The patient's tolerance of                            previous anesthesia was also reviewed. The risks                            and benefits of the procedure and the sedation                            options and risks were discussed with the patient.                            All questions were answered, and informed consent                            was obtained. Prior Anticoagulants: The patient has                            taken no anticoagulant or antiplatelet agents. ASA                            Grade Assessment: II - A patient with mild systemic                            disease. After reviewing the risks and benefits,                            the patient was deemed in satisfactory condition to                            undergo the procedure.  After obtaining informed consent, the colonoscope                            was passed under direct vision. Throughout the                            procedure, the patient's blood pressure, pulse, and                            oxygen saturations were monitored continuously. The                            [id number]-HQ190L Colonoscope [id number] was introduced                            through the anus and advanced to the the terminal                             ileum, with identification of the appendiceal                            orifice and IC valve. The colonoscopy was performed                            without difficulty. The patient tolerated the                            procedure well. The quality of the bowel                            preparation was adequate. The terminal ileum,                            ileocecal valve, appendiceal orifice, and rectum                            were photographed. Scope In: 8:18:53 AM Scope Out: 8:35:13 AM Scope Withdrawal Time: 0 hours 12 minutes 18 seconds  Total Procedure Duration: 0 hours 16 minutes 20 seconds  Findings:                 Skin tags were found on perianal exam.                           The terminal ileum appeared normal.                           A large amount of liquid stool was found in the                            entire colon, making visualization difficult.                            Lavage of the area was performed using copious  amounts of fluid, resulting in clearance with                            adequate visualization.                           Many small-mouthed diverticula were found in the                            entire colon, severe in left colon, mild in                            transverse / right colon                           Internal hemorrhoids were found during                            retroflexion. The hemorrhoids were small.                           The exam was otherwise without abnormality. Complications:            No immediate complications. Estimated blood loss:                            None. Estimated Blood Loss:     Estimated blood loss: none. Impression:               - Perianal skin tags found on perianal exam.                           - The examined portion of the ileum was normal.                           - Stool in the entire examined colon leading to                             extensive lavage.                           - Diverticulosis in the entire examined colon.                           - Internal hemorrhoids.                           - The examination was otherwise normal.                           - No polyps.                           No cause for iron deficiency on this exam. Recommendation:           - Patient has a contact number available for  emergencies. The signs and symptoms of potential                            delayed complications were discussed with the                            patient. Return to normal activities tomorrow.                            Written discharge instructions were provided to the                            patient.                           - Resume previous diet.                           - Continue present medications.                           - No further colonoscopy exams recommended given                            the patient's age and the results of this exam                            (would not be due again for another 10 years, at                            which time she will be 73 years old - most people                            stop routine exams at this age)                           - Trend Hgb on iron Landon Pinion P. Cezar Misiaszek, MD 09/15/2023 8:47:03 AM This report has been signed electronically.

## 2023-09-15 NOTE — Patient Instructions (Addendum)
 Resume previous diet Continue present medications, stop prilosec and carafate  Samples for Voquezna  given, try daily for 15 days and office will look into insurance coverage for this if it is working for you. Await pathology results There were no colon polyps seen today!  You will NOT need another screening colonoscopy, HOWEVER,  Please call us  at [phone] if you have a change in bowel habits, change in family history of colo-rectal cancer, rectal bleeding or other GI concern in the future.  Handouts/information given for gastric polyps, Hiatal Hernia, GERD, Barrett's esophagus, diverticulosis and hemorrhoids  YOU HAD AN ENDOSCOPIC PROCEDURE TODAY AT THE [facility]:   Refer to the procedure report that was given to you for any specific questions about what was found during the examination.  If the procedure report does not answer your questions, please call your gastroenterologist to clarify.  If you requested that your care partner not be given the details of your procedure findings, then the procedure report has been included in a sealed envelope for you to review at your convenience later.  YOU SHOULD EXPECT: Some feelings of bloating in the abdomen. Passage of more gas than usual.  Walking can help get rid of the air that was put into your GI tract during the procedure and reduce the bloating. If you had a lower endoscopy (such as a colonoscopy or flexible sigmoidoscopy) you may notice spotting of blood in your stool or on the toilet paper. If you underwent a bowel prep for your procedure, you may not have a normal bowel movement for a few days.  Please Note:  You might notice some irritation and congestion in your nose or some drainage.  This is from the oxygen used during your procedure.  There is no need for concern and it should clear up in a day or so.  SYMPTOMS TO REPORT IMMEDIATELY:  Following lower endoscopy (colonoscopy):  Excessive amounts of blood in the  stool  Significant tenderness or worsening of abdominal pains  Swelling of the abdomen that is new, acute  Fever of 100F or higher Following upper endoscopy (EGD)  Vomiting of blood or coffee ground material  New chest pain or pain under the shoulder blades  Painful or persistently difficult swallowing  New shortness of breath  Black, tarry-looking stools For urgent or emergent issues, a gastroenterologist can be reached at any hour by calling [phone]. Do not use MyChart messaging for urgent concerns.   DIET:  We do recommend a small meal at first, but then you may proceed to your regular diet.  Drink plenty of fluids but you should avoid alcoholic beverages for 24 hours.  ACTIVITY:  You should plan to take it easy for the rest of today and you should NOT DRIVE or use heavy machinery until tomorrow (because of the sedation medicines used during the test).    FOLLOW UP: Our staff will call the number listed on your records the next business day following your procedure.  We will call around 7:15- 8:00 am to check on you and address any questions or concerns that you may have regarding the information given to you following your procedure. If we do not reach you, we will leave a message.     If any biopsies were taken you will be contacted by phone or by letter within the next 1-3 weeks.  Please call us  at [phone] if you have not heard about the biopsies in 3 weeks.   SIGNATURES/CONFIDENTIALITY: You  and/or your care partner have signed paperwork which will be entered into your electronic medical record.  These signatures attest to the fact that that the information above on your After Visit Summary has been reviewed and is understood.  Full responsibility of the confidentiality of this discharge information lies with you and/or your care-partner.

## 2023-09-15 NOTE — Progress Notes (Signed)
 [facility] History and Physical   Primary Care Physician:  Lory Rough., PA-C   Reason for Procedure:   Iron deficiency anemia, GERD, Barrett's, history of colon polyps  Plan:    EGD and colonoscopy     HPI: Jennifer Alvarez is a 73 y.o. female  here for EGD and colonoscopy - history of GERD s/p Nissen with symptoms, on omeprazole  40mg  BID. Recently noted to have IDA. Last colonoscopy 2018 with an adenoma. Had carafate  added and feeling better lately.  Denies bowel symptoms.   Otherwise feels well without any cardiopulmonary symptoms.   I have discussed risks / benefits of anesthesia and endoscopic procedure with Doyal Genera and they wish to proceed with the exams as outlined today.    Past Medical History:  Diagnosis Date   Allergy    Arthritis    Cataract    minor   Complication of anesthesia    states chipped teeth with intubation   Fuchs' corneal dystrophy    Gastroparesis 08/19/2000   GERD (gastroesophageal reflux disease)    Hearing loss    hearing aids   Hiatal hernia    Hypertension    Iron deficiency    on Iron supplements   Migraine    migraines in past   Osteoporosis    PONV (postoperative nausea and vomiting)    Seasonal allergies    Sleep apnea    Resvent Machine uses distilled water. 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   Vertigo     Past Surgical History:  Procedure Laterality Date   ABDOMINAL HYSTERECTOMY  2005   BLADDER REPAIR     BUNIONECTOMY     right foot   COLONOSCOPY     LAPAROSCOPIC NISSEN FUNDOPLICATION  12/18/2011   Procedure: LAPAROSCOPIC NISSEN FUNDOPLICATION;  Surgeon: Azucena Bollard, MD;  Location: [facility];  Service: General;  Laterality: N/A;  Redo Lap Nissen Fundoplication takedown of herniated nissen fundoplication redo hiatal hernia repair   NISSEN FUNDOPLICATION  2002   x2  2002, 2013   PELVIC LAPAROSCOPY     hysterectomy   RHINOPLASTY  1980   UPPER GASTROINTESTINAL ENDOSCOPY      Prior to Admission medications   Medication Sig Start Date End Date Taking? Authorizing Provider  amLODipine (NORVASC) 5 MG tablet Take 1 tablet by mouth daily. 07/28/20  Yes [provider]  calcium carbonate (OS-CAL) 1250 (500 Ca) MG chewable tablet Chew 1 tablet by mouth daily.   Yes [provider]  estradiol  (VIVELLE -DOT) 0.025 MG/24HR Place 1 patch onto the skin 2 (two) times a week. 03/24/20  Yes [provider]  ferrous sulfate 324 MG TBEC Take 324 mg by mouth daily with breakfast. Taking a Vegan type-pt cannot have red dye   Yes [provider]  fluticasone  (FLONASE ) 50 MCG/ACT nasal spray 2 sprays daily. 06/27/21  Yes [provider]  Multiple Vitamins-Minerals (PRESERVISION AREDS 2 PO) Take 1 tablet by mouth daily.   Yes [provider]  Olopatadine  HCl 0.2 % SOLN Pataday  Once Daily Relief 09/22/20  Yes [provider]  omeprazole  (PRILOSEC) 40 MG capsule TAKE 1 CAPSULE BY MOUTH TWICE A DAY 03/25/22  Yes Averill Pons, Lendon Queen, MD  sertraline (ZOLOFT) 25 MG tablet Take 25 mg by mouth daily. 03/22/19  Yes [provider]  sucralfate  (  CARAFATE ) 1 g tablet Take 1 tablet (1 g total) by mouth 4 (four) times daily. One hour before meals. 06/25/23  Yes McMichael, Bayley M, PA-C  Vitamin D, Ergocalciferol, (DRISDOL) 1.25 MG (50000 UNIT) CAPS capsule Take 1 capsule by mouth once a week. 07/13/23 07/12/24 Yes [provider]  EPINEPHrine 0.3 mg/0.3 mL IJ SOAJ injection AS DIRECTED INJECTION AS NEEDED FOR SYSTEMIC REACTIONS Patient not taking: Reported on 09/15/2023    [provider]  glucosamine-chondroitin 500-400 MG tablet Take 2 tablets by mouth daily.    [provider]  loratadine  (CLARITIN ) 10 MG tablet Take 10 mg by mouth daily. Patient not taking: Reported on 09/15/2023    [provider]    Current Outpatient Medications  Medication Sig Dispense Refill   amLODipine (NORVASC) 5 MG tablet Take 1 tablet by mouth daily.     calcium carbonate (OS-CAL) 1250 (500 Ca) MG chewable tablet Chew 1 tablet by mouth daily.     estradiol  (VIVELLE -DOT) 0.025 MG/24HR Place 1 patch onto the skin 2 (two) times a week.     ferrous sulfate 324 MG TBEC Take 324 mg by mouth daily with breakfast. Taking a Vegan type-pt cannot have red dye     fluticasone  (FLONASE ) 50 MCG/ACT nasal spray 2 sprays daily.     Multiple Vitamins-Minerals (PRESERVISION AREDS 2 PO) Take 1 tablet by mouth daily.     Olopatadine  HCl 0.2 % SOLN Pataday  Once Daily Relief     omeprazole  (PRILOSEC) 40 MG capsule TAKE 1 CAPSULE BY MOUTH TWICE A DAY 180 capsule 2   sertraline (ZOLOFT) 25 MG tablet Take 25 mg by mouth daily.     sucralfate  (CARAFATE ) 1 g tablet Take 1 tablet (1 g total) by mouth 4 (four) times daily. One hour before meals. 120 tablet 3   Vitamin D, Ergocalciferol, (DRISDOL) 1.25 MG (50000 UNIT) CAPS capsule Take 1 capsule by mouth once a week.     EPINEPHrine 0.3 mg/0.3 mL IJ SOAJ injection AS DIRECTED INJECTION AS NEEDED FOR SYSTEMIC REACTIONS (Patient not taking: Reported on 09/15/2023)     glucosamine-chondroitin 500-400 MG tablet Take 2 tablets by mouth daily.     loratadine  (CLARITIN ) 10 MG tablet Take 10 mg by mouth daily. (Patient not taking: Reported on 09/15/2023)     Current Facility-Administered Medications  Medication Dose Route Frequency Provider Last Rate Last Admin   0.9 %  sodium chloride  infusion  500 mL Intravenous Once Ace Holder, MD        Allergies as of 09/15/2023 - Review Complete 09/15/2023  Allergen Reaction Noted   Bee venom Anaphylaxis 09/17/2011   Peanut-containing drug products Anaphylaxis 09/17/2011   Other Swelling 09/17/2011   Celebrex [celecoxib] Palpitations 07/07/2011   Demerol Rash 07/07/2011   Latex Dermatitis 03/29/2022   Tape Other (See Comments)  08/06/2016   Valium Rash 07/07/2011   Vicodin [hydrocodone-acetaminophen ] Nausea And Vomiting 07/07/2011    Family History  Problem Relation Age of Onset   Hypertension Mother        age 16   Anxiety disorder Mother    Depression Mother    Asthma Mother    COPD Mother    Stroke Mother    Anorexia nervosa Mother    Heart disease Father    Esophageal cancer Father 17       died age 107   Migraines Father    Colon cancer Neg Hx    Rectal cancer Neg Hx    Stomach cancer  Neg Hx    Pancreatic cancer Neg Hx    Colon polyps Neg Hx     Social History   Socioeconomic History   Marital status: Married    Spouse name: Royston Cornea   Number of children: 2   Years of education: Not on file   Highest education level: Bachelor's degree (e.g., BA, AB, BS)  Occupational History    Employer: [employer]    Comment: retired  Tobacco Use   Smoking status: Never   Smokeless tobacco: Never  Vaping Use   Vaping status: Never Used  Substance and Sexual Activity   Alcohol use: Never   Drug use: Never   Sexual activity: Not on file  Other Topics Concern   Not on file  Social History Narrative   Lives with husband   No caffeine    Social Drivers of Corporate investment banker Strain: Low Risk  (06/15/2020)   Received from [facility] visits prior to 06/29/2022., [facility] visits prior to 06/29/2022.   Overall Financial Resource Strain (CARDIA)    Difficulty of Paying Living Expenses: Not hard at all  Food Insecurity: Low Risk  (07/01/2023)   [facility]   Hunger Vital Sign    Worried About Running Out of Food in the Last Year: Never true    Ran Out of Food in the Last Year: Never true  Transportation Needs: No Transportation Needs (07/01/2023)   Received from [facility]    In the past 12 months, has lack of reliable transportation kept you from medical appointments, meetings, work or from getting things  needed for daily living? : No  Physical Activity: Insufficiently Active (06/15/2020)   Received from [facility] visits prior to 06/29/2022., [facility] visits prior to 06/29/2022.   Exercise Vital Sign    Days of Exercise per Week: 3 days    Minutes of Exercise per Session: 30 min  Stress: Stress Concern Present (06/15/2020)   [facility] visits prior to 06/29/2022., [facility] visits prior to 06/29/2022.   [employer] of Occupational Health - Occupational Stress Questionnaire    Feeling of Stress : To some extent  Social Connections: Moderately Integrated (06/15/2020)   Received from [facility] visits prior to 06/29/2022., [facility] visits prior to 06/29/2022.   Social Advertising account executive [NHANES]    Frequency of Communication with Friends and Family: Twice a week    Frequency of Social Gatherings with Friends and Family: Once a week    Attends Religious Services: More than 4 times per year    Active Member of [employer] or Organizations: No    Attends Banker Meetings: Never    Marital Status: Married  Catering manager Violence: Not At Risk (06/15/2020)   [facility] prior to 06/29/2022., [facility] visits prior to 06/29/2022.   Humiliation, Afraid, Rape, and Kick questionnaire    Fear of Current or Ex-Partner: No    Emotionally Abused: No    Physically Abused: No    Sexually Abused: No    Review of Systems: All other review of systems negative except as mentioned in the HPI.  Physical Exam: Vital signs BP (!) 144/61   Pulse 70   Temp 97.7 F (36.5 C) (Temporal)  Ht 4' 10.5" (1.486 m)   Wt 129 lb (58.5 kg)   SpO2 98%   BMI 26.50 kg/m   General:   Alert,  Well-developed, pleasant and cooperative in NAD Lungs:  Clear throughout to auscultation.    Heart:  Regular rate and rhythm Abdomen:  Soft, nontender and nondistended.   Neuro/Psych:  Alert and cooperative. Normal mood and affect. A and O x 3  Christi Coward, MD [facility]

## 2023-09-15 NOTE — Progress Notes (Signed)
 To pacu, VSS. Reort to [email or web address].tb

## 2023-09-16 ENCOUNTER — Telehealth: Payer: Self-pay | Admitting: *Deleted

## 2023-09-16 NOTE — Telephone Encounter (Signed)
  Follow up Call-     09/15/2023    7:22 AM  Call back number  Post procedure Call Back phone  # [phone]  Permission to leave phone message Yes     Patient questions:  Do you have a fever, pain , or abdominal swelling? No. Pain Score  0 *  Have you tolerated food without any problems? Yes.    Have you been able to return to your normal activities? Yes.    Do you have any questions about your discharge instructions: Diet   No. Medications  No. Follow up visit  No.  Do you have questions or concerns about your Care? No.  Actions: * If pain score is 4 or above: No action needed, pain <4.

## 2023-09-17 ENCOUNTER — Ambulatory Visit: Payer: Self-pay | Admitting: Gastroenterology

## 2023-09-17 DIAGNOSIS — D509 Iron deficiency anemia, unspecified: Secondary | ICD-10-CM

## 2023-09-17 LAB — SURGICAL PATHOLOGY

## 2023-09-18 ENCOUNTER — Telehealth: Payer: Self-pay | Admitting: Gastroenterology

## 2023-09-18 MED ORDER — VOQUEZNA 10 MG PO TABS: 10.0000 mg | ORAL_TABLET | Freq: Every day | ORAL | 5 refills | Status: DC

## 2023-09-18 NOTE — Telephone Encounter (Signed)
 Prescription for Voquezna  sent to [facility]. Called and spoke to patient.  She indicated Dr. General Kenner told her to stop Carafate .  I let her know that she may hear from [facility] and hopefully her insurance will cover it.   She understands she also needs to go to the lab for CBC and IBC/Ferritin the week after next.

## 2023-09-18 NOTE — Telephone Encounter (Signed)
 Patient states that Voquezna  is working well for her and is requesting a [facility] [place].   Also, she is wanting to know if this is the only med that she should be on?

## 2023-09-19 ENCOUNTER — Telehealth: Payer: Self-pay | Admitting: *Deleted

## 2023-09-19 NOTE — Telephone Encounter (Signed)
 Received a call from patient's insurance (phone [phone]) requesting ICD10 codes for patient's voquezna . ICD10 Codes provided were GERD K21.9 and Barrett's esophagus K22.70. Advised that patient has previously tried and failed dexlansopraozle and omeprazole  and that patient is currently having heartburn symptoms.

## 2023-09-23 NOTE — Telephone Encounter (Signed)
 Fax from [facility] [employer] indicates Voquezna  10 mg has been approved from 09-19-23 thru 04-28-24.  New PA will be required after 04-28-24.

## 2023-09-25 ENCOUNTER — Other Ambulatory Visit (INDEPENDENT_AMBULATORY_CARE_PROVIDER_SITE_OTHER)

## 2023-09-25 DIAGNOSIS — D509 Iron deficiency anemia, unspecified: Secondary | ICD-10-CM

## 2023-09-25 LAB — CBC WITH DIFFERENTIAL/PLATELET
Basophils Absolute: 0 10*3/uL (ref 0.0–0.1)
Basophils Relative: 0.8 % (ref 0.0–3.0)
Eosinophils Absolute: 0.2 10*3/uL (ref 0.0–0.7)
Eosinophils Relative: 2.8 % (ref 0.0–5.0)
HCT: 38.3 % (ref 36.0–46.0)
Hemoglobin: 12.6 g/dL (ref 12.0–15.0)
Lymphocytes Relative: 34.6 % (ref 12.0–46.0)
Lymphs Abs: 2.1 10*3/uL (ref 0.7–4.0)
MCHC: 32.9 g/dL (ref 30.0–36.0)
MCV: 79.8 fl (ref 78.0–100.0)
Monocytes Absolute: 0.4 10*3/uL (ref 0.1–1.0)
Monocytes Relative: 6.3 % (ref 3.0–12.0)
Neutro Abs: 3.4 10*3/uL (ref 1.4–7.7)
Neutrophils Relative %: 55.5 % (ref 43.0–77.0)
Platelets: 266 10*3/uL (ref 150.0–400.0)
RBC: 4.81 Mil/uL (ref 3.87–5.11)
RDW: 17.5 % — ABNORMAL HIGH (ref 11.5–15.5)
WBC: 6.1 10*3/uL (ref 4.0–10.5)

## 2023-09-25 LAB — IBC + FERRITIN
Ferritin: 8.6 ng/mL — ABNORMAL LOW (ref 10.0–291.0)
Iron: 38 ug/dL — ABNORMAL LOW (ref 42–145)
Saturation Ratios: 9.1 % — ABNORMAL LOW (ref 20.0–50.0)
TIBC: 417.2 ug/dL (ref 250.0–450.0)
Transferrin: 298 mg/dL (ref 212.0–360.0)

## 2023-09-28 ENCOUNTER — Ambulatory Visit: Payer: Self-pay | Admitting: Gastroenterology

## 2023-09-28 DIAGNOSIS — D509 Iron deficiency anemia, unspecified: Secondary | ICD-10-CM

## 2023-10-03 NOTE — Telephone Encounter (Signed)
 PT is calling to discuss voquezna . She said that if she could have a 90 day supply of the medication sent instead of a 30 day with [facility] her insurance will cover it and it would be cheaper. Please advise

## 2023-10-03 NOTE — Telephone Encounter (Signed)
 Left message for patient to return my call.

## 2023-10-06 ENCOUNTER — Other Ambulatory Visit: Payer: Self-pay

## 2023-10-06 MED ORDER — VOQUEZNA 10 MG PO TABS: 10.0000 mg | ORAL_TABLET | Freq: Every day | ORAL | 1 refills | Status: DC

## 2023-10-06 NOTE — Telephone Encounter (Signed)
 Called [facility] pharmacy and authorized 90 day supply with 1 refill.

## 2023-10-27 MED ORDER — VOQUEZNA 10 MG PO TABS: 10.0000 mg | ORAL_TABLET | Freq: Every day | ORAL | 3 refills | Status: DC

## 2023-10-27 NOTE — Telephone Encounter (Signed)
 A  90 day supply was sent to [facility] on 6-9.  Patient had indicated that was cheaper for her. Called patient and she said she only got 30 from [facility] the last time (in May). She said she has had nothing but trouble with reaching them and then getting her medication in the mail in a timely manner. She said she doesn't want to deal with them anymore and would [facility].  She [facility]

## 2023-10-27 NOTE — Telephone Encounter (Signed)
 Patient requesting medication [facility]ill [facility]. Please advise.

## 2023-10-27 NOTE — Progress Notes (Signed)
 Per patient request, [facility] day sypply of [facility]  [facility]. She doesnt want to use [facility] anymore. See other TE

## 2023-11-20 ENCOUNTER — Telehealth: Payer: Self-pay | Admitting: Gastroenterology

## 2023-11-20 MED ORDER — VOQUEZNA 10 MG PO TABS: 10.0000 mg | ORAL_TABLET | Freq: Every day | ORAL | 0 refills | Status: DC

## 2023-11-20 NOTE — Telephone Encounter (Signed)
 90 day voquezna  rx sent.

## 2023-11-20 NOTE — Telephone Encounter (Signed)
 Inbound call from patient stated she spoke to her insurance in regards to medication Voquezna  and they stated a 90 day supply would work best since its 250 and a 30 day supply would be 100.  Patient stated she has enough for the next week but if we can put in a prescription [facility] [place]   Please advise  Thank you

## 2023-12-15 ENCOUNTER — Other Ambulatory Visit (INDEPENDENT_AMBULATORY_CARE_PROVIDER_SITE_OTHER)

## 2023-12-15 DIAGNOSIS — D509 Iron deficiency anemia, unspecified: Secondary | ICD-10-CM | POA: Diagnosis not present

## 2023-12-15 LAB — CBC WITH DIFFERENTIAL/PLATELET
Basophils Absolute: 0 K/uL (ref 0.0–0.1)
Basophils Relative: 0.8 % (ref 0.0–3.0)
Eosinophils Absolute: 0.1 K/uL (ref 0.0–0.7)
Eosinophils Relative: 2.9 % (ref 0.0–5.0)
HCT: 40.8 % (ref 36.0–46.0)
Hemoglobin: 13.5 g/dL (ref 12.0–15.0)
Lymphocytes Relative: 36.5 % (ref 12.0–46.0)
Lymphs Abs: 1.7 K/uL (ref 0.7–4.0)
MCHC: 33.1 g/dL (ref 30.0–36.0)
MCV: 85.5 fl (ref 78.0–100.0)
Monocytes Absolute: 0.4 K/uL (ref 0.1–1.0)
Monocytes Relative: 8.5 % (ref 3.0–12.0)
Neutro Abs: 2.4 K/uL (ref 1.4–7.7)
Neutrophils Relative %: 51.3 % (ref 43.0–77.0)
Platelets: 248 K/uL (ref 150.0–400.0)
RBC: 4.77 Mil/uL (ref 3.87–5.11)
RDW: 16.7 % — ABNORMAL HIGH (ref 11.5–15.5)
WBC: 4.8 K/uL (ref 4.0–10.5)

## 2023-12-15 LAB — IBC + FERRITIN
Ferritin: 11 ng/mL (ref 10.0–291.0)
Iron: 140 ug/dL (ref 42–145)
Saturation Ratios: 38.2 % (ref 20.0–50.0)
TIBC: 366.8 ug/dL (ref 250.0–450.0)
Transferrin: 262 mg/dL (ref 212.0–360.0)

## 2023-12-16 ENCOUNTER — Ambulatory Visit: Payer: Self-pay | Admitting: Gastroenterology

## 2023-12-17 NOTE — Telephone Encounter (Signed)
PT confirmed  appointment for tomorrow.

## 2023-12-17 NOTE — Progress Notes (Unsigned)
 HPI :  73 year old female here for follow-up for GERD, Barrett's, IDA.   History of Nissen fundoplication 2003 with a Niesen fundoplication redo in 2013.   Since she was last seen in the office she was noted to have iron deficiency anemia.  She was having a reaction of migraines from red dye in the iron, so using a formulation over-the-counter that does not have it which she tolerates better.  She had an EGD and colonoscopy in May to further evaluate, as outlined below  EGD remarkable for 4 cm hiatal hernia, loosening of fundoplication.  She had a stable short segment of Barrett's esophagus (1 cm), biopsies without dysplasia.  At the time she was taking omeprazole  40 mg twice daily, and had Carafate  added as well.  She was continuing to have pretty severe reflux symptoms on that regimen.  After the EGD I gave her a sample of Voquenza 10 mg daily.  She states this has been a miracle and has worked Adult nurse.  She states 1 pill daily has done much better at controlling her symptoms and she is quite pleased with the regimen.  She really does not have any problems with reflux that bother her if she takes it every day.  It is considerably more expensive than the omeprazole  for her however she feels it is worse of her quality of life. She states DEXA scan is up-to-date as of last year with her primary care, her kidney function is normal.  She has otherwise been feeling pretty well.  No abdominal pains that bother her routinely.  She does not take any NSAIDs.  No blood in her stools.  Her colonoscopy showed diverticulosis but otherwise no concerning findings.  No clear cause for her iron deficiency anemia.  On iron her anemia resolved, hemoglobin increased to 13.5, and her iron panel normalized, last labs were 3 days ago.  She feels well today without complaints     PREVIOUS GI WORKUP    EGD - July 2016 per Dr. Obie with finding of grade B distal esophagitis and a 4 cm hiatal hernia and  functioning fundoplication.  Biopsies from the esophagus show changes consistent with GERD, no Barrett's.   Colonoscopy - February 2018 with multiple diverticuli in the left colon, 2 polyps were removed, one was a tubular adenoma, one was hyperplastic.   US  06/28/19 - IMPRESSION: GB normal no stones Probable fatty infiltration of liver as above. Otherwise negative exam.   CT abdomen / pelvis 07/29/2016: IMPRESSION: 1. A specific cause for chronic right lower quadrant pain is not identified. 2. Distal esophageal wall thickening (probably from esophagitis) with a small to moderate-sized hiatal hernia and what appears to be a partially un wrapped Nissen fundoplication also herniated as part of the hiatal hernia. 3. Left foraminal impingement at L5-S1 due to spurring. 4. Sigmoid diverticulosis without active diverticulitis. 5.  Prominent stool throughout the colon favors constipation.     EGD 09/20/20: - A 4 cm hiatal hernia was present. - The Z-line was irregular and was found 35 cm from the incisors, with a tongue of salmon colored mucosa about 1cm in length. Biopsies were taken with a cold forceps for histology. - The examined esophagus was tortuous with an angulated turn entering the hernia sac. - The exam of the esophagus was otherwise normal. No stenosis / stricture appreciated. - Evidence of a Nissen fundoplication was found in the cardia. It was slightly loose. - The exam of the stomach was otherwise normal. - The  duodenal bulb and second portion of the duodenum were normal.   Surgical [P], Irregular z line bx - BARRETT ESOPHAGUS - NO DYSPLASIA OR MALIGNANCY IDENTIFIED    Iron deficiency anemia, Follow-up of gastro-esophageal reflux disease with short segment BE - history of Nissen fundoplication, intermittent reflux symptoms on omeprazole  40mg . Added carafate  which has helped EGD 09/15/23: - Esophagogastric landmarks were identified: the Z-line was found at 31 cm, the  gastroesophageal junction was found at 31 cm and the upper extent of the gastric folds was found at 35 cm from the incisors. Findings: - A suspected 4 cm hiatal hernia was present. Mildly erythematous mucosa within the hernia sac. - The Z-line was irregular - roughly 1cm tongue of salmon colored mucosa. Biopsies were taken with a cold forceps for histology. - The distal esophagus was tortuous with an angulated turn into the hernia sac. - The exam of the esophagus was otherwise normal. - A single 3 mm sessile polyp was found in the gastric body. The polyp was removed with a cold biopsy forceps. Likely fundic gland polyp - rule out adenoma. Resection and retrieval were complete. - Patchy mildly erythematous mucosa was found in the gastric body. - Evidence of a Nissen fundoplication was found in the cardia. The wrap appeared loose. - The exam of the stomach was otherwise normal. - Biopsies were taken with a cold forceps for Helicobacter pylori testing. - The examined duodenum was normal.  Consider switch from omeprazole  to Voquezna  10mg  / day if covered by insurance (we can try some free samples if available in the office) - Await pathology results.  Colonoscopy 09/15/23: - Skin tags were found on perianal exam. - The terminal ileum appeared normal. - A large amount of liquid stool was found in the entire colon, making visualization difficult. Lavage of the area was performed using copious amounts of fluid, resulting in clearance with adequate visualization. - Many small-mouthed diverticula were found in the entire colon, severe in left colon, mild in transverse / right colon - Internal hemorrhoids were found during retroflexion. The hemorrhoids were small. - The exam was otherwise without abnormality.    FINAL DIAGNOSIS       1. Surgical [P], gastric antrum and gastric body :      -  ANTRAL AND OXYNTIC MUCOSA WITH NO SIGNIFICANT PATHOLOGY.      -  NO HELICOBACTER PYLORI ORGANISMS ARE IDENTIFIED ON THE H&E  STAINED SLIDE.       2. Surgical [P], gastric polyp, polyp (1) :      -  POLYPOID OXYNTIC MUCOSA SUGGESTIVE OF A FUNDIC GLAND POLYP.       3. Surgical [P], GE junction :      -  SQUAMOCOLUMNAR JUNCTIONAL MUCOSA WITH EXTENSIVE INTESTINAL METAPLASIA      CONSISTENT WITH BARRETT'S ESOPHAGUS IN THE APPROPRIATE ENDOSCOPIC SETTING.      -  NEGATIVE FOR DYSPLASIA.     Lab Results  Component Value Date   WBC 4.8 12/15/2023   HGB 13.5 12/15/2023   HCT 40.8 12/15/2023   MCV 85.5 12/15/2023   PLT 248.0 12/15/2023    Lab Results  Component Value Date   IRON 140 12/15/2023   TIBC 366.8 12/15/2023   FERRITIN 11.0 12/15/2023      Past Medical History:  Diagnosis Date   Allergy    Arthritis    Cataract    minor   Complication of anesthesia    states chipped teeth with intubation   Fuchs' corneal  dystrophy    Gastroparesis 08/19/2000   GERD (gastroesophageal reflux disease)    Hearing loss    hearing aids   Hiatal hernia    Hypertension    Iron deficiency    on Iron supplements   Migraine    migraines in past   Osteoporosis    PONV (postoperative nausea and vomiting)    Seasonal allergies    Sleep apnea    Resvent Machine uses distilled water. 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   Vertigo      Past Surgical History:  Procedure Laterality Date   ABDOMINAL HYSTERECTOMY  2005   BLADDER REPAIR     BUNIONECTOMY     right foot   COLONOSCOPY     LAPAROSCOPIC NISSEN FUNDOPLICATION  12/18/2011   Procedure: LAPAROSCOPIC NISSEN FUNDOPLICATION;  Surgeon: Donnice KATHEE Lunger, MD;  Location: [facility];  Service: General;  Laterality: N/A;  Redo Lap Nissen Fundoplication takedown of herniated nissen fundoplication redo hiatal hernia repair   NISSEN FUNDOPLICATION  2002   x2 2002, 2013   PELVIC LAPAROSCOPY     hysterectomy   RHINOPLASTY  1980   UPPER  GASTROINTESTINAL ENDOSCOPY     Family History  Problem Relation Age of Onset   Hypertension Mother        age 88   Anxiety disorder Mother    Depression Mother    Asthma Mother    COPD Mother    Stroke Mother    Anorexia nervosa Mother    Heart disease Father    Esophageal cancer Father 29       died age 37   Migraines Father    Colon cancer Neg Hx    Rectal cancer Neg Hx    Stomach cancer Neg Hx    Pancreatic cancer Neg Hx    Colon polyps Neg Hx    Social History   Tobacco Use   Smoking status: Never   Smokeless tobacco: Never  Vaping Use   Vaping status: Never Used  Substance Use Topics   Alcohol use: Never   Drug use: Never   Current Outpatient Medications  Medication Sig Dispense Refill   amLODipine (NORVASC) 5 MG tablet Take 1 tablet by mouth daily.     calcium carbonate (OS-CAL) 1250 (500 Ca) MG chewable tablet Chew 1 tablet by mouth daily.     estradiol  (VIVELLE -DOT) 0.025 MG/24HR Place 1 patch onto the skin 2 (two) times a week. (Patient taking differently: Place 1 patch onto the skin 2 (two) times a week. Takes once weekly.)     ferrous sulfate 324 MG TBEC Take 324 mg by mouth daily with breakfast. Taking a Vegan type-pt cannot have red dye     fluticasone  (FLONASE ) 50 MCG/ACT nasal spray 2 sprays daily.     Multiple Vitamins-Minerals (PRESERVISION AREDS 2 PO) Take 1 tablet by mouth daily.     Olopatadine  HCl 0.2 % SOLN Pataday  Once Daily Relief     sertraline (ZOLOFT) 25 MG tablet Take 25 mg by mouth daily.     Vitamin D, Ergocalciferol, (DRISDOL) 1.25 MG (50000 UNIT) CAPS capsule Take 1 capsule by mouth once a week.     Vonoprazan Fumarate  (VOQUEZNA ) 10 MG TABS Take 10 mg by mouth daily. 90 tablet 0   No current facility-administered medications for this visit.  Allergies  Allergen Reactions   Bee Venom Anaphylaxis   Peanut-Containing Drug Products Anaphylaxis    Itching around mouth and throat   Other Swelling    Tree pollen Congestion, watery eyes,  swelling to face /eyes   Celebrex [Celecoxib] Palpitations   Demerol Rash   Latex Dermatitis   Tape Other (See Comments)    Tape and Bandaids: Skin irritation   Valium Rash   Vicodin [Hydrocodone-Acetaminophen ] Nausea And Vomiting     Review of Systems: All systems reviewed and negative except where noted in HPI.   Lab Results  Component Value Date   WBC 4.8 12/15/2023   HGB 13.5 12/15/2023   HCT 40.8 12/15/2023   MCV 85.5 12/15/2023   PLT 248.0 12/15/2023   Lab Results  Component Value Date   IRON 140 12/15/2023   TIBC 366.8 12/15/2023   FERRITIN 11.0 12/15/2023    Physical Exam: BP 126/74   Pulse 66   Ht 4' 10.5 (1.486 m)   Wt 128 lb 2 oz (58.1 kg)   BMI 26.32 kg/m  Constitutional: Pleasant,well-developed, female in no acute distress. Neurological: Alert and oriented to person place and time. Psychiatric: Normal mood and affect. Behavior is normal.   ASSESSMENT: 73 y.o. female here for assessment of the following  1. Gastroesophageal reflux disease, unspecified whether esophagitis present   2. Barrett's esophagus without dysplasia   3. Hiatal hernia   4. Long-term current use of proton pump inhibitor therapy   5. Iron deficiency anemia, unspecified iron deficiency anemia type    Reviewed recent endoscopy with her, short segment of Barrett's esophagus in the setting of loosening of her Nissen fundoplication and recurrent hiatal hernia.  Unfortunately she has had 2 surgeries for this and it has recurred.  Fortunately, switching to Voquezna  from omeprazole  has worked remarkably well, significant improvement in control of her symptoms currently.  We discussed long-term risks of this regimen, she is up-to-date on her DEXA scan we will continue to see her primary care for bone density screening, no reported CKD.  She understands risks however given her significant symptoms in light of endoscopic findings I think benefits outweigh risks.  We discussed Barrett's  esophagus, this is a low risk segment but I think reasonable to do surveillance exam in 5 years if the patient is otherwise feeling well at that time to ensure no progression or changes.   Colonoscopy otherwise unremarkable for cause of IDA.  On oral iron her iron deficiency anemia has resolved.  We discussed AGA guidelines on IDA, given her iron deficiency resolved with iron supplementation, I think we can monitor her blood counts for now.  If IDA recurs or persists then would pursue capsule endoscopy.  She asymptomatic otherwise.  She agrees with the plan.   PLAN: - counseled on GERD and BE, long term risks - switched to Voquezna  10mg  / day - working really well, will continue for now - repeat EGD in 5 years - continue iron, can decrease to once daily dose - repeat CBC and TIBC / ferritin in 3 months - if IDA recurs, then capsule endoscopy  Marcey Naval, MD [facility]

## 2023-12-18 ENCOUNTER — Encounter: Payer: Self-pay | Admitting: Gastroenterology

## 2023-12-18 ENCOUNTER — Telehealth: Payer: Self-pay

## 2023-12-18 ENCOUNTER — Ambulatory Visit (INDEPENDENT_AMBULATORY_CARE_PROVIDER_SITE_OTHER): Admitting: Gastroenterology

## 2023-12-18 VITALS — BP 126/74 | HR 66 | Ht 58.5 in | Wt 128.1 lb

## 2023-12-18 DIAGNOSIS — K449 Diaphragmatic hernia without obstruction or gangrene: Secondary | ICD-10-CM | POA: Diagnosis not present

## 2023-12-18 DIAGNOSIS — Z79899 Other long term (current) drug therapy: Secondary | ICD-10-CM

## 2023-12-18 DIAGNOSIS — K219 Gastro-esophageal reflux disease without esophagitis: Secondary | ICD-10-CM | POA: Diagnosis not present

## 2023-12-18 DIAGNOSIS — D509 Iron deficiency anemia, unspecified: Secondary | ICD-10-CM

## 2023-12-18 DIAGNOSIS — K2270 Barrett's esophagus without dysplasia: Secondary | ICD-10-CM | POA: Diagnosis not present

## 2023-12-18 NOTE — Telephone Encounter (Signed)
 Error

## 2023-12-18 NOTE — Patient Instructions (Signed)
 Continue Voquezna . Please let us  know which pharmacy you would like us  to send refills to.  You will be due for a recall Endoscopy in 08-2028. We will send you a reminder in the mail when it gets closer to that time.  Decrease your iron to once daily.  You will be due for labs in 3 months.  We will remind you when it is time to go to the pharmacy.  Thank you for entrusting me with your care and for choosing [facility], Dr. Elspeth Naval    _______________________________________________________  If your blood pressure at your visit was 140/90 or greater, please contact your primary care physician to follow up on this.  _______________________________________________________  If you are age 73 or older, your body mass index should be between 23-30. Your Body mass index is 26.32 kg/m. If this is out of the aforementioned range listed, please consider follow up with your Primary Care Provider.  If you are age 54 or younger, your body mass index should be between 19-25. Your Body mass index is 26.32 kg/m. If this is out of the aformentioned range listed, please consider follow up with your Primary Care Provider.   ________________________________________________________  The [facility] providers would like to encourage you to use MYCHART to communicate with providers for non-urgent requests or questions.  Due to long hold times on the telephone, sending your provider a message by [facility] may be a faster and more efficient way to get a response.  Please allow 48 business hours for a response.  Please remember that this is for non-urgent requests.  _______________________________________________________  [facility] is using a team-based approach to care.  Your team is made up of your doctor and two to three APPS. Our APPS (Nurse Practitioners and Physician Assistants) work with your physician to ensure care continuity for you. They are fully qualified to address your  health concerns and develop a treatment plan. They communicate directly with your gastroenterologist to care for you. Seeing the Advanced Practice Practitioners on your physician's team can help you by facilitating care more promptly, often allowing for earlier appointments, access to diagnostic testing, procedures, and other specialty referrals.

## 2024-01-26 ENCOUNTER — Ambulatory Visit: Payer: PPO | Admitting: Adult Health

## 2024-01-26 ENCOUNTER — Telehealth: Payer: Self-pay | Admitting: Adult Health

## 2024-01-26 ENCOUNTER — Encounter: Payer: Self-pay | Admitting: Adult Health

## 2024-01-26 VITALS — BP 125/73 | HR 72 | Ht 59.0 in | Wt 129.0 lb

## 2024-01-26 DIAGNOSIS — G43809 Other migraine, not intractable, without status migrainosus: Secondary | ICD-10-CM

## 2024-01-26 DIAGNOSIS — G4733 Obstructive sleep apnea (adult) (pediatric): Secondary | ICD-10-CM | POA: Diagnosis not present

## 2024-01-26 NOTE — Telephone Encounter (Signed)
 FYI.  Pt called stating that she forgot to tell MD that she can not have anything with red dye in it because it gives her headaches and she wanted MD To add that to Pt chart .

## 2024-01-26 NOTE — Telephone Encounter (Signed)
 Place red dye placed on allergy list

## 2024-01-26 NOTE — Patient Instructions (Signed)
 Continue using CPAP nightly and greater than 4 hours each night If your symptoms worsen or you develop new symptoms please let us  know.

## 2024-01-26 NOTE — Progress Notes (Signed)
 I reviewed the above note and documentation by the Nurse Practitioner and agree with the history, exam, assessment and plan as outlined above. I was available for consultation.  Recommend repeat brain MRI with and without contrast since her migraine pattern has changed and also evaluation with ophthalmology. True Mar, MD, PhD [facility])

## 2024-01-26 NOTE — Progress Notes (Signed)
 PATIENT: [facility] DOB: [birth year]  REASON FOR VISIT: follow up HISTORY FROM: patient PRIMARY NEUROLOGIST: Dr. Buck  Chief Complaint  Patient presents with   Follow-up    Pt in 4 alone Here for cpap f/u  Pt states issues with mask .      HISTORY OF PRESENT ILLNESS: Today 01/26/24:  Jennifer Alvarez is a 73 y.o. female with a history of obstructive sleep apnea on CPAP and vertigo. Returns today for follow-up.  In regards to sleep apnea.  She has a fullface mask.  She does feel that it leaks at night.  She has purchased a CPAP pillow but does not find that it is as helpful.  She is a mouth breather.  Her download is below.  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  She states that she has often woke up with pink spots in her vision and she states that she knows that this is the start of a vertigo episode.  She typically can do some of the exercises she learned in vestibular rehab and the symptoms are resolved.  In the past she has a history of migraine headaches.  She returns today for an evaluation.      01/27/23: Jennifer Alvarez is a 73 y.o. female with a history of OSA on CPAP and veritgo. Returns today for follow-up.  She reports that the CPAP is working well.  She does state that her mask is leaking.  She has never had a mask refitting.  She is trying to change out her supplies regularly.  Intermittently she had a visit with Dr. Buck for vertigo.  She was sent to vestibular rehab.  She has found that beneficial and has been doing the exercises at home.       01/29/2022 (copied from Dr. Obie note) : She requested a sooner appointment due to a recent episode of vertigo, it was similar to what she had before.  She had sudden onset of spinning sensation, no vomiting, no one-sided weakness or numbness or tingling, symptoms lasted for about 24 hours.  She did have a dull headache, nothing like her previous migraines, she  takes Tylenol  as needed, she does not really have any severe migraines any longer.  She is faithful with her AutoPap machine.  I was able to review her compliance data from 12/27/2021 through 01/25/2022, which is a total of 30 days, during which time she used her machine every night with percent use days greater than 4 hours at 93%, indicating excellent compliance with an average usage of 7 hours and 11 minutes, residual AHI at goal at 4/h, pressure for the 95th percentile at 10.4 cm with a range of 5 to 11 cm with EPR of 2.  Leak on the higher side with the 95th percentile at 32 L/min.  She has adjusted well to treatment, she is pleased with her AutoPap machine, she had to change machines due to a defect in her previous machine.  She still indicates good benefit from treatment.  She has not seen ENT recently.  She had physical therapy through [facility] physical therapy in the past, she still has some of the exercises that they gave her, she tried to do those exercises but it did not seem to help.  She is agreeable to a referral to physical therapy.  No obvious trigger for this last vertigo attack, had a normal day, tries to keep  well-hydrated on a day-to-day basis.   06/04/21: [name] is a 73 year old female with a history of obstructive sleep apnea on CPAP and migraine headaches.  She returns today for follow-up.  We have not been able to get a download since she got the new machine.  She supposed be getting a different machine from her DME company.  She reports that since using the machine she is only had 2 migraine headaches.  Denies any new issues.  Returns today for an evaluation.   HISTORY (copied from Dr. Obie note) 12/04/2020: I was not able to review her AutoPap data today as the serial number was not typed incorrectly and we were not tagged correctly.  Patient reports that she is using her machine nightly, she has had some difficulty adjusting, initially the machine was turned off spontaneously a  few times and she had to take it back to the DME provider.  There was another occasion where the machine turned on spontaneously during the day.  She took the machine back.  She was told that she may need a replacement if she had ongoing issues with it.  She reports some modest improvement in her sleep consolidation and daytime energy.  She no longer snores audibly per husband.  She had to adjust the mask because of leaking.  The mouth is quite dry in the mornings although she has the humidity on a higher setting.  The humidifier should be worn but she reports that she only is feeling cool air.  She may need to get this looked at again by the DME provider.  As far as her migraines, she has not had a migraine attack, she could not fill the Ubrelvy  due to cost.  She has not even had to use Tylenol , she has had an occasional frontal pressure-like headache but it does not last more than 10 or 15 minutes at a time.  She has had occasional positional dizziness which is brief, typically when she is standing up too quickly but no significant vertigo-like attack.  She has been using bilateral hearing aids.  She has not had any new concerns.  She has not had a follow-up with ENT.  REVIEW OF SYSTEMS: Out of a complete 14 system review of symptoms, the patient complains only of the following symptoms, and all other reviewed systems are negative.   ESS 18 FSS 17    ALLERGIES: Allergies  Allergen Reactions   Bee Venom Anaphylaxis   Peanut-Containing Drug Products Anaphylaxis    Itching around mouth and throat   Other Swelling    Tree pollen Congestion, watery eyes, swelling to face /eyes   Celebrex [Celecoxib] Palpitations   Demerol Rash   Latex Dermatitis   Tape Other (See Comments)    Tape and Bandaids: Skin irritation   Valium Rash   Vicodin [Hydrocodone-Acetaminophen ] Nausea And Vomiting    HOME MEDICATIONS: Outpatient Medications Prior to Visit  Medication Sig Dispense Refill   amLODipine  (NORVASC) 5 MG tablet Take 1 tablet by mouth daily.     calcium carbonate (OS-CAL) 1250 (500 Ca) MG chewable tablet Chew 1 tablet by mouth daily.     estradiol  (VIVELLE -DOT) 0.025 MG/24HR Place 1 patch onto the skin 2 (two) times a week. (Patient taking differently: Place 1 patch onto the skin once a week. Takes once weekly.)     ferrous sulfate 324 MG TBEC Take 324 mg by mouth daily with breakfast. Taking a Vegan type-pt cannot have red dye (Patient taking differently: Take  65 mg by mouth daily with breakfast. Taking a Vegan type-pt cannot have red dye)     fluticasone  (FLONASE ) 50 MCG/ACT nasal spray 2 sprays daily.     Multiple Vitamins-Minerals (PRESERVISION AREDS 2 PO) Take 1 tablet by mouth daily.     Olopatadine  HCl 0.2 % SOLN Pataday  Once Daily Relief     sertraline (ZOLOFT) 25 MG tablet Take 25 mg by mouth daily.     Vitamin D, Ergocalciferol, (DRISDOL) 1.25 MG (50000 UNIT) CAPS capsule Take 1 capsule by mouth once a week.     Vonoprazan Fumarate  (VOQUEZNA ) 10 MG TABS Take 10 mg by mouth daily. 90 tablet 0   No facility-administered medications prior to visit.    PAST MEDICAL HISTORY: Past Medical History:  Diagnosis Date   Allergy    Arthritis    Cataract    minor   Complication of anesthesia    states chipped teeth with intubation   Fuchs' corneal dystrophy    Gastroparesis 08/19/2000   GERD (gastroesophageal reflux disease)    Hearing loss    hearing aids   Hiatal hernia    Hypertension    Iron deficiency    on Iron supplements   Migraine    migraines in past   Osteoporosis    PONV (postoperative nausea and vomiting)    Seasonal allergies    Sleep apnea    Resvent Machine uses distilled water. 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   Vertigo     PAST SURGICAL HISTORY: Past Surgical History:  Procedure Laterality Date   ABDOMINAL  HYSTERECTOMY  2005   BLADDER REPAIR     BUNIONECTOMY     right foot   COLONOSCOPY     LAPAROSCOPIC NISSEN FUNDOPLICATION  12/18/2011   Procedure: LAPAROSCOPIC NISSEN FUNDOPLICATION;  Surgeon: Donnice KATHEE Lunger, MD;  Location: [facility];  Service: General;  Laterality: N/A;  Redo Lap Nissen Fundoplication takedown of herniated nissen fundoplication redo hiatal hernia repair   NISSEN FUNDOPLICATION  2002   x2 2002, 2013   PELVIC LAPAROSCOPY     hysterectomy   RHINOPLASTY  1980   UPPER GASTROINTESTINAL ENDOSCOPY      FAMILY HISTORY: Family History  Problem Relation Age of Onset   Hypertension Mother        age 45   Anxiety disorder Mother    Depression Mother    Asthma Mother    COPD Mother    Stroke Mother    Anorexia nervosa Mother    Heart disease Father    Esophageal cancer Father 66       died age 43   Migraines Father    Colon cancer Neg Hx    Rectal cancer Neg Hx    Stomach cancer Neg Hx    Pancreatic cancer Neg Hx    Colon polyps Neg Hx    Sleep apnea Neg Hx     SOCIAL HISTORY: Social History   Socioeconomic History   Marital status: Married    Spouse name: Lynwood   Number of children: 2   Years of education: Not on file   Highest education level: Bachelor's degree (e.g., BA, AB, BS)  Occupational History    Employer: [employer]    Comment: retired  Tobacco Use   Smoking status: Never   Smokeless tobacco: Never  Vaping Use   Vaping status: Never Used  Substance and Sexual Activity   Alcohol use: Never   Drug use: Never   Sexual activity: Not on file  Other Topics Concern   Not on file  Social History Narrative   Lives with husband   No caffeine    Retired    Teacher, early years/pre 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   Overall Financial Resource Strain (CARDIA)    Difficulty of Paying Living Expenses: Not hard at all  Food Insecurity: Low Risk   (07/01/2023)   [facility]   Hunger Vital Sign    Within the past 12 months, you worried that your food would run out before you got money to buy more: Never true    Within the past 12 months, the food you bought just didn't last and you didn't have money to get more. : Never true  Transportation Needs: No Transportation Needs (07/01/2023)   Received from [facility]    In the past 12 months, has lack of reliable transportation kept you from medical appointments, meetings, work or from getting things needed for daily living? : No  Physical Activity: Insufficiently Active (06/15/2020)   Received from [facility] visits prior to 06/29/2022.   Exercise Vital Sign    On average, how many days per week do you engage in moderate to strenuous exercise (like a brisk walk)?: 3 days    On average, how many minutes do you engage in exercise at this level?: 30 min  Stress: Stress Concern Present (06/15/2020)   Received from [facility] visits prior to 06/29/2022.   [employer] of Occupational Health - Occupational Stress Questionnaire    Feeling of Stress : To some extent  Social Connections: Moderately Integrated (06/15/2020)   Received from [facility] visits prior to 06/29/2022.   Social Connection and Isolation Panel    In a typical week, how many times do you talk on the phone with family, friends, or neighbors?: Twice a week    How often do you get together with friends or relatives?: Once a week    How often do you attend church or religious services?: More than 4 times per year    Do you belong to any clubs or organizations such as church groups, unions, fraternal or athletic groups, or school groups?: No    How often do you attend meetings of the clubs or organizations you belong to?: Never    Are you married, widowed, divorced, separated, never married, or living with a partner?: Married  Intimate  Partner Violence: Not At Risk (06/15/2020)   Received from [facility] visits prior to 06/29/2022.   Humiliation, Afraid, Rape, and Kick questionnaire    Within the last year, have you been afraid of your partner or ex-partner?: No    Within the last year, have you been humiliated or emotionally abused in other ways by your partner or ex-partner?: No    Within the last year, have you been kicked, hit, slapped, or otherwise physically hurt by your partner or ex-partner?: No    Within the last year, have you been raped or forced to have any kind of sexual activity by your partner or ex-partner?: No      PHYSICAL EXAM  Vitals:   01/26/24  1344  BP: 125/73  Pulse: 72  Weight: 129 lb (58.5 kg)  Height: 4' 11 (1.499 m)    Body mass index is 26.05 kg/m.  Generalized: Well developed, in no acute distress  Chest: Lungs clear to auscultation bilaterally  Neurological examination  Mentation: Alert oriented to time, place, history taking. Follows all commands speech and language fluent Cranial nerve II-XII: Extraocular movements were full, visual field were full on confrontational test Head turning and shoulder shrug  were normal and symmetric. Motor: The motor testing reveals 5 over 5 strength of all 4 extremities. Good symmetric motor tone is noted throughout.  Sensory: Sensory testing is intact to soft touch on all 4 extremities. No evidence of extinction is noted.  Gait and station: Gait is normal.    DIAGNOSTIC DATA (LABS, IMAGING, TESTING) - I reviewed patient records, labs, notes, testing and imaging myself where available.  Lab Results  Component Value Date   WBC 4.8 12/15/2023   HGB 13.5 12/15/2023   HCT 40.8 12/15/2023   MCV 85.5 12/15/2023   PLT 248.0 12/15/2023      Component Value Date/Time   NA 140 12/12/2011 1545   K 3.9 12/12/2011 1545   CL 102 12/12/2011 1545   CO2 29 12/12/2011 1545   GLUCOSE 107 (H) 12/12/2011 1545   BUN 9 06/17/2016 1631    CREATININE 0.82 06/17/2016 1631   CALCIUM 9.3 12/12/2011 1545   GFRNONAA 88 (L) 12/18/2011 1455   GFRAA >90 12/18/2011 1455     ASSESSMENT AND PLAN 73 y.o. year old female  has a past medical history of Allergy, Arthritis, Cataract, Complication of anesthesia, Fuchs' corneal dystrophy, Gastroparesis (08/19/2000), GERD (gastroesophageal reflux disease), Hearing loss, Hiatal hernia, Hypertension, Iron deficiency, Migraine, Osteoporosis, PONV (postoperative nausea and vomiting), Seasonal allergies, Sleep apnea, Sleep apnea with use of continuous positive airway pressure (CPAP), Thyroid disease, and Vertigo. here with:  OSA on CPAP  -CPAP shows excellent compliance -Good treatment of apnea -Will send for mask refitting-can try DreamWear fullface - Encourage patient to use CPAP nightly and > 4 hours each night   2.  Vertigo-vestibular migraine?  -Continue exercises learned at vestibular rehab. - Did caution the patient about history of aura in conjunction with estrogen and increase stroke risk. - Overall relatively stable does not feel she needs medication at this time    Follow-up in 1 year or sooner if needed     Duwaine Russell, MSN, NP-C 01/26/2024, 1:54 PM [facility] [address], [address] [place], [place] [address] [phone]  The patient's condition requires frequent monitoring and adjustments in the treatment plan, reflecting the ongoing complexity of care.  This provider is the continuing focal point for all needed services for this condition.

## 2024-01-27 ENCOUNTER — Telehealth: Payer: Self-pay | Admitting: *Deleted

## 2024-01-27 DIAGNOSIS — R29818 Other symptoms and signs involving the nervous system: Secondary | ICD-10-CM

## 2024-01-27 DIAGNOSIS — G43809 Other migraine, not intractable, without status migrainosus: Secondary | ICD-10-CM

## 2024-01-27 NOTE — Telephone Encounter (Signed)
-----   Message from Duwaine Russell sent at 01/26/2024  5:03 PM EDT ----- Please let the patient know that I discussed with Dr. Buck.  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  If she is amenable I will place order ----- Message ----- From: Buck Saucer, MD Sent: 01/26/2024   4:20 PM EDT To: Duwaine Russell, NP  See addendum made: Recommend repeat brain MRI with and without contrast since her migraine pattern has changed and also evaluation with ophthalmology.  sa ----- Message ----- From: Russell Duwaine, NP Sent: 01/26/2024   4:13 PM EDT To: Saucer Buck, MD  Patient has history of vestibular migraine.  However today she reports that she gets an aura which consists of pink dots prior to her vertigo episode.  Overall her exam was relatively unremarkable.  These events only happen approximately twice a month.  Do feel that any additional imaging is needed since she now has an aura?

## 2024-01-27 NOTE — Telephone Encounter (Signed)
 Spoke to patient agreed to move forward with MRI . Pt thanked me for calling . Informed patient may take 7-14 business days for insurance approval Pt expressed understanding

## 2024-01-27 NOTE — Telephone Encounter (Signed)
 no auth required sent to GI [phone]

## 2024-02-10 ENCOUNTER — Ambulatory Visit
Admission: RE | Admit: 2024-02-10 | Discharge: 2024-02-10 | Disposition: A | Discharge status: Home / Self Care | Source: Ambulatory Visit | Attending: Adult Health | Admitting: Adult Health

## 2024-02-10 DIAGNOSIS — G43809 Other migraine, not intractable, without status migrainosus: Secondary | ICD-10-CM | POA: Diagnosis not present

## 2024-02-10 DIAGNOSIS — R29818 Other symptoms and signs involving the nervous system: Secondary | ICD-10-CM

## 2024-02-10 MED ORDER — GADOPICLENOL 0.5 MMOL/ML IV SOLN
6.0000 mL | Freq: Once | INTRAVENOUS | Status: AC | PRN
  Administered 2024-02-10: 6 mL via INTRAVENOUS

## 2024-02-13 ENCOUNTER — Ambulatory Visit: Payer: Self-pay | Admitting: Adult Health

## 2024-02-13 DIAGNOSIS — D509 Iron deficiency anemia, unspecified: Secondary | ICD-10-CM

## 2024-03-04 NOTE — Telephone Encounter (Signed)
 MyChart message to patient to go to the lab for cbc and ibc/ferritin

## 2024-03-04 NOTE — Telephone Encounter (Signed)
-----   Message from [facility] H sent at 12/18/2023  8:51 AM EDT ----- Regarding: due for labs in Nov Patient will be due for CBC and IBC/Ferritin in mid November

## 2024-03-09 ENCOUNTER — Telehealth: Payer: Self-pay

## 2024-03-09 NOTE — Telephone Encounter (Signed)
 Called patient and requested she go to the lab for Dr. DELENA. She is feeling under the weather but will plan to go next week. Orders are in for CBC and IBC/Ferritin

## 2024-03-16 ENCOUNTER — Other Ambulatory Visit (INDEPENDENT_AMBULATORY_CARE_PROVIDER_SITE_OTHER)

## 2024-03-16 DIAGNOSIS — D509 Iron deficiency anemia, unspecified: Secondary | ICD-10-CM | POA: Diagnosis not present

## 2024-03-16 LAB — IBC + FERRITIN
Ferritin: 9.7 ng/mL — ABNORMAL LOW (ref 10.0–291.0)
Iron: 67 ug/dL (ref 42–145)
Saturation Ratios: 16.6 % — ABNORMAL LOW (ref 20.0–50.0)
TIBC: 403.2 ug/dL (ref 250.0–450.0)
Transferrin: 288 mg/dL (ref 212.0–360.0)

## 2024-03-17 ENCOUNTER — Other Ambulatory Visit: Payer: Self-pay

## 2024-03-17 ENCOUNTER — Ambulatory Visit: Payer: Self-pay | Admitting: Gastroenterology

## 2024-03-17 DIAGNOSIS — D509 Iron deficiency anemia, unspecified: Secondary | ICD-10-CM

## 2024-03-17 LAB — CBC WITH DIFFERENTIAL/PLATELET
Basophils Absolute: 0 x10E3/uL (ref 0.0–0.2)
Basos: 0 %
EOS (ABSOLUTE): 0.2 x10E3/uL (ref 0.0–0.4)
Eos: 3 %
Hematocrit: 42.2 % (ref 34.0–46.6)
Hemoglobin: 13.6 g/dL (ref 11.1–15.9)
Immature Grans (Abs): 0 x10E3/uL (ref 0.0–0.1)
Immature Granulocytes: 0 %
Lymphocytes Absolute: 2.1 x10E3/uL (ref 0.7–3.1)
Lymphs: 30 %
MCH: 28.9 pg (ref 26.6–33.0)
MCHC: 32.2 g/dL (ref 31.5–35.7)
MCV: 90 fL (ref 79–97)
Monocytes Absolute: 0.5 x10E3/uL (ref 0.1–0.9)
Monocytes: 7 %
Neutrophils Absolute: 4.1 x10E3/uL (ref 1.4–7.0)
Neutrophils: 59 %
Platelets: 329 x10E3/uL (ref 150–450)
RBC: 4.71 x10E6/uL (ref 3.77–5.28)
RDW: 13 % (ref 11.7–15.4)
WBC: 6.9 x10E3/uL (ref 3.4–10.8)

## 2024-03-29 ENCOUNTER — Telehealth: Payer: Self-pay

## 2024-03-29 ENCOUNTER — Other Ambulatory Visit (HOSPITAL_COMMUNITY): Payer: Self-pay

## 2024-03-29 NOTE — Telephone Encounter (Signed)
 Pharmacy Patient Advocate Encounter   Received notification from Pt Calls Messages that prior authorization for Voquezna  10MG  tablets is required/requested.   Insurance verification completed.   The patient is insured through [facility] ADVANT[employer]/RX ADVANCE.   Per test claim: Prior authorization not required

## 2024-03-29 NOTE — Telephone Encounter (Signed)
-----   Message from [facility] [place] H sent at 09/23/2023  2:20 PM EDT ----- Regarding: PA for Voquezna  10 mg 5-25 Fax from [facility] [employer] indicates Voquezna  10 mg has been approved from 09-19-23 thru 04-28-24.  New PA will be required after 04-28-24.   They indicated the request should be sent 30 days prior to 12-31. Send request to PA team to do renewal PA

## 2024-03-29 NOTE — Telephone Encounter (Signed)
 Tried processing prior authorization but cancels out stating that no prior authorization is required.

## 2024-03-29 NOTE — Telephone Encounter (Signed)
 Please initiate PA for Voquezna . Thank you

## 2024-04-07 ENCOUNTER — Encounter

## 2024-04-26 ENCOUNTER — Telehealth: Payer: Self-pay

## 2024-04-26 NOTE — Telephone Encounter (Signed)
 Pt called to reschedule CAPSULE ENDO WQ. Pt requesting call back.  Please advise thank you.

## 2024-04-30 ENCOUNTER — Other Ambulatory Visit: Payer: Self-pay

## 2024-04-30 DIAGNOSIS — D509 Iron deficiency anemia, unspecified: Secondary | ICD-10-CM

## 2024-04-30 NOTE — Telephone Encounter (Signed)
 Spoke with pt and she is rescheduled for VCE 05/10/24 at 8:30am. Pt has prep instructions, amb ref in epic.

## 2024-04-30 NOTE — Progress Notes (Signed)
"  Left message for pt to call back   "

## 2024-05-10 ENCOUNTER — Ambulatory Visit: Admitting: Gastroenterology

## 2024-05-10 ENCOUNTER — Telehealth: Payer: Self-pay | Admitting: Gastroenterology

## 2024-05-10 DIAGNOSIS — D509 Iron deficiency anemia, unspecified: Secondary | ICD-10-CM

## 2024-05-10 NOTE — Telephone Encounter (Signed)
 Spoke with patient. Patient states there are flashing blue lights on the top of the recorder. Patient states the inside of the recorder is a grey screen. Instructed patient she does not have to do anything with the inside. As long as the lights are flashing on the top the recorder should be doing what it is supposed to.

## 2024-05-10 NOTE — Patient Instructions (Signed)
 You may have clear liquids beginning at 10:30 am after ingesting the capsule.    You can have a light lunch at 12:30 pm; sandwich and half bowl of soup.  Return to the office at 4 pm to return the equipment.   Return to you normal diet at 5 pm.   Call [phone] and ask for Hilja Kintzel RN if you have any questions.  You should pass the capsule in your stool 8-48 hours after ingestion. If you have not passed the capsule, after 72 hours, please contact the office at [phone].

## 2024-05-10 NOTE — Progress Notes (Unsigned)
 ID: [id number] Exp: 2025-08-29 LOT: [id number]  Patient arrived for VCE. Reported the prep went well. This RN explained capsule dietary restrictions for the next few hours. Pt advised to return at 4 pm to return capsule equipment.  Patient verbalized understanding. Opened capsule, ensured capsule was flashing prior to the patient swallowing the capsule. Patient swallowed capsule without difficulty.  Patient told to call the office with any questions and if capsule has not passed after 72 hours. No further questions by the conclusion of the visit.

## 2024-05-10 NOTE — Telephone Encounter (Signed)
 Patient requesting a call to discuss concerns she has regarding monitor from today's capsule endo. Requesting to speak with Melissa. Please advise, thank you

## 2024-05-13 ENCOUNTER — Telehealth: Payer: Self-pay

## 2024-05-13 ENCOUNTER — Other Ambulatory Visit: Payer: Self-pay

## 2024-05-13 ENCOUNTER — Ambulatory Visit
Admission: RE | Admit: 2024-05-13 | Discharge: 2024-05-13 | Disposition: A | Source: Ambulatory Visit | Attending: Gastroenterology

## 2024-05-13 DIAGNOSIS — T184XXA Foreign body in colon, initial encounter: Secondary | ICD-10-CM | POA: Diagnosis not present

## 2024-05-13 NOTE — Telephone Encounter (Signed)
 Patient had capsule done a few days ago and was concerned as she does not think she has passed the capsule. KUB ordered and pt is going downstairs for xray. Dr. Leigh notified.

## 2024-05-13 NOTE — Progress Notes (Signed)
 SABRA  ku

## 2024-05-14 ENCOUNTER — Ambulatory Visit: Payer: Self-pay | Admitting: Gastroenterology

## 2024-05-14 NOTE — Telephone Encounter (Signed)
 Sorry to clarify Rock - can you please let her know my recommendations / results. thanks

## 2024-05-14 NOTE — Telephone Encounter (Signed)
 Got it - is she is any pain at all? Capsule can sometime take days to pass, is not a problem if in the colon. If eating okay / no vomiting / no pain, will monitor. I will contact radiology to review x ray

## 2024-05-14 NOTE — Telephone Encounter (Signed)
 I looked at the x ray with radiology - I can see the capsule in the RUQ - I think it is probably in the hepatic flexure of her colon. She has a significant stool burden and appears constipated, this may take several days to pass. There is no small bowel dilation. As long as the capsule has reached her colon it will very likely pass on its own in the upcoming days. She may or may not see it pass. If she has no change in symptoms, no pain, etc, then would just monitor. Not sure what she is taking for a bowel regimen, if anything, but if not taking anything and constipated can take some miralax PRN.  The capsule will take a few days for interpretation, may have a read on it next week for her and will let her know. Thanks

## 2024-05-14 NOTE — Telephone Encounter (Signed)
 Spoke with pt and she is aware of recommendations per Dr. Leigh. She will try taking some more miralax. She is not having any discomfort.

## 2024-05-18 ENCOUNTER — Other Ambulatory Visit: Payer: Self-pay | Admitting: Gastroenterology

## 2024-05-20 ENCOUNTER — Ambulatory Visit
Admission: RE | Admit: 2024-05-20 | Discharge: 2024-05-20 | Disposition: A | Discharge status: Home / Self Care | Source: Ambulatory Visit | Attending: Nurse Practitioner | Admitting: Nurse Practitioner

## 2024-05-20 ENCOUNTER — Telehealth: Payer: Self-pay | Admitting: Gastroenterology

## 2024-05-20 DIAGNOSIS — T189XXD Foreign body of alimentary tract, part unspecified, subsequent encounter: Secondary | ICD-10-CM

## 2024-05-20 NOTE — Telephone Encounter (Signed)
 Order entered in [facility]. Patient instructed on Colleen's recommendations below. Patient will come by today to get the x-ray completed. Patient states she only used the Miralax twice. Instructed patient if Dr Leigh has any other recommendations, our office will reach out to her tomorrow when he returns. Patient questioned when she would know something about the capsule endoscopy & instructed it takes approximately 2 weeks for the info & our office would be back in touch with her regarding the results.

## 2024-05-20 NOTE — Telephone Encounter (Signed)
 Jennifer Alvarez, as Dr. Leigh is off this afternoon, please let the patient know I do not have access to her small bowel capsule endoscopy results at this time. The video capsule might pass without her seeing it.  She can hold off on the Miralax if she is having too many loose stools at this time. Send her for another abdominal xray to verify if the capsule has passed or not. Defer further recommendations to Dr. Leigh, he will be in the clinic 05/21/2024.

## 2024-05-20 NOTE — Telephone Encounter (Signed)
 Inbound call from patient stating she has been having loose stools. States she has questions in regards to capsule endo. Please advise, thank you

## 2024-05-20 NOTE — Telephone Encounter (Signed)
 Thanks for the update.  On the prior x ray the capsule was likely in her colon per radiology. She should know that many people don't see it come out so she does not have to continually take laxatives for monitor for that, it has probably already passed on its own. If she is not having pain / vomiting, etc, I would just monitor. If she really wants to know if the capsule has passed we can do an xray but normally that is not necessary. Hopefully we may have the report for her by next week with further recommendations. Thanks

## 2024-05-20 NOTE — Telephone Encounter (Signed)
 Spoke to patient. Patient had a capsule endo completed on 05/10/24 & follow up x-ray on 05/13/24. Patient states after multiple search parties she has still not seen the capsule. Patient states she started herself on Miralax on Sunday to try to get things moving. Patient states she is still having to use her depends for this yellow baby poop that keeps coming out. Patient reports she has been eating light & her husband thinks she just needs to eat more but she is concerned. Patient report RLQ aching since December with sharp pains intermittently. Patient denies any fever or vomiting.

## 2024-05-20 NOTE — Telephone Encounter (Signed)
 Patient did come for her x-ray today.

## 2024-05-21 MED ORDER — VOQUEZNA 10 MG PO TABS: 1.0000 | ORAL_TABLET | Freq: Every day | ORAL | 3 refills | Status: AC

## 2024-05-21 NOTE — Telephone Encounter (Signed)
 Genna can you let the patient know I don't see any evidence of the capsule on the follow up x ray - assuming she passed this. Final read per radiology, probably will not be back until next week. I will get her results of capsule, hopefully by next week. Thanks

## 2024-05-21 NOTE — Telephone Encounter (Signed)
 Spoke to patient & informed of Dr Hassan message below. Instructed once he has the capsule endoscopy results our office will be back in touch with her. Patient states she is due for a refill of the Voquezna  & her pharmacy informed her it would be ~$800/month and she will not do that. Voquezna  10 mg daily #90 with 3 refills sent to [facility]. [facility] number given to patient if needed. Patient instructed if the cost is too much through this pharmacy to let us  know. Patient verbalized understanding.

## 2024-05-21 NOTE — Addendum Note (Signed)
 Addended by: Kieran Nachtigal D on: 05/21/2024 12:57 PM   Modules accepted: Orders

## 2024-05-24 ENCOUNTER — Ambulatory Visit: Payer: Self-pay | Admitting: Nurse Practitioner

## 2024-05-25 ENCOUNTER — Telehealth: Payer: Self-pay | Admitting: Gastroenterology

## 2024-05-25 DIAGNOSIS — D509 Iron deficiency anemia, unspecified: Secondary | ICD-10-CM

## 2024-05-25 NOTE — Telephone Encounter (Signed)
 To clarify, I am working inpatient at [facility] next week, not [facility]. I can do MC on Monday AM, if she can't do that let me know we can try another morning that week. Thanks

## 2024-05-25 NOTE — Telephone Encounter (Signed)
 Spoke with patient. Patient scheduled for Mon, 07/12/24 @ 3:40 PM.

## 2024-05-25 NOTE — Telephone Encounter (Signed)
Yes that is okay, thanks

## 2024-05-25 NOTE — Telephone Encounter (Signed)
 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   This unfortunately can happen but is not common. Can you let the patient know unfortunately what happened and that this study is considered to be incomplete. The only way to do it at this point would be to do the procedure at the hospital with endoscopic placement of the capsule. This would entail doing an EGD under sedation and then I would place the capsule into her small intestine. If she wishes to do that, I could accommodate her next week at the hospital, if they have any openings to allow me to do that one morning (could do Monday AM). If she wishes to do this can you please book her for it. If not, and she wants to hold off, I can see her in the office in a few months for reassessment.  Of note, the follow up xray shows the capsule did pass out of her body, even if she didn't see it.  POD A RN can you let me know how she wishes to proceed.  Sheri - of note, I have tried to ammend the charge for this capsule which was placed when she did the test. The test is incomplete and charge needs to be modified to show that, but I am having a hard time pulling it up to make that change, if there is any way you can assist. thanks

## 2024-05-25 NOTE — Telephone Encounter (Signed)
 Jennifer Alvarez - this is nonurgent, I think okay to book her in March if she is okay with that. Thank you!

## 2024-05-25 NOTE — Telephone Encounter (Signed)
 Informed patient of result note below. Patient would prefer to come in to see Dr Leigh and discuss this. Will inform Dr Leigh to see where to add her to his schedule.

## 2024-05-28 ENCOUNTER — Encounter: Payer: Self-pay | Admitting: Gastroenterology

## 2024-07-12 ENCOUNTER — Ambulatory Visit: Admitting: Gastroenterology

## 2025-02-03 ENCOUNTER — Ambulatory Visit: Admitting: Adult Health
